# Patient Record
Sex: Male | Born: 1970
Health system: Western US, Academic
[De-identification: ages and names within clinical notes are randomized; demographics above are authoritative.]

## PROBLEM LIST (undated history)

## (undated) DIAGNOSIS — M5412 Radiculopathy, cervical region: Secondary | ICD-10-CM

## (undated) DIAGNOSIS — F419 Anxiety disorder, unspecified: Secondary | ICD-10-CM

## (undated) HISTORY — PX: HEMORROIDECTOMY: SUR656

## (undated) HISTORY — PX: ELBOW ARTHROSCOPY: SUR87

## (undated) HISTORY — PX: CARPAL TUNNEL RELEASE: SHX101

## (undated) HISTORY — DX: Anxiety disorder, unspecified: F41.9

## (undated) HISTORY — DX: Radiculopathy, cervical region: M54.12

---

## 2017-11-17 ENCOUNTER — Encounter (HOSPITAL_BASED_OUTPATIENT_CLINIC_OR_DEPARTMENT_OTHER): Payer: Self-pay

## 2017-12-26 ENCOUNTER — Encounter (HOSPITAL_BASED_OUTPATIENT_CLINIC_OR_DEPARTMENT_OTHER): Payer: Self-pay | Admitting: Gastroenterology

## 2017-12-26 ENCOUNTER — Ambulatory Visit (HOSPITAL_BASED_OUTPATIENT_CLINIC_OR_DEPARTMENT_OTHER): Payer: 59 | Attending: Gastroenterology | Admitting: Gastroenterology

## 2017-12-26 VITALS — BP 137/95 | HR 72 | Temp 97.7°F | Resp 15 | Ht 72.0 in | Wt 225.3 lb

## 2017-12-26 DIAGNOSIS — B182 Chronic viral hepatitis C: Secondary | ICD-10-CM | POA: Insufficient documentation

## 2017-12-26 DIAGNOSIS — Z6830 Body mass index (BMI) 30.0-30.9, adult: Secondary | ICD-10-CM

## 2017-12-26 NOTE — Progress Notes (Signed)
HEPATOLOGY CLINIC INITIAL CONSULTATION NOTE    OTHER PROVIDERS  PCP Miguel Dibble, ARNP      IDENTIFICATION/CHIEF COMPLAINT  [name] is a 47 year old male sent in consultation by PCP Miguel Dibble, ARNP for evaluation of chronic HCV infection.    HISTORY OF PRESENT ILLNESS  [name] is a 47 year old male who was originally diagnosed with hepatitis C about 5 years ago.  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  The following historical data was determined via prior notes and discussion with the patient today:  Fibrosis staging: Fibrosure F1  Prior treatment: None  Genotype: None    HIV negative  [id number]    His only complaint is wrist pain.  Otherwise, he feels well.    He recently received the second dose of HBV vaccine.    REVIEW OF SYSTEMS  Complete review of systems is negative except as noted in History of Present Illness.    PAST MEDICAL HISTORY  1. Chronic HCV infiection    ALLERGIES  Review of patient's allergies indicates:  No Known Allergies    MEDICATIONS  1. HCV  2. Neck and back pain - meditation    FAMILY HISTORY      SOCIAL HISTORY  The patient currently lives in [place] with his mom.  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 Looking for work now.    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  Sees counselor weekly and treatment once a week.        PHYSICAL EXAM  BP (!) 137/95    Pulse 72    Temp 97.7 F (36.5 C) (Temporal)    Resp 15    Ht 6' (1.829 m)    Wt (!) 225 lb 5 oz (102.2 kg)    BMI 30.56 kg/m     Constitutional/ General Apperance: Alert, no distress  Eyes: EOMI, no scleral icterus.  Oropharynx: Lips, mucosa, and tongue normal.  Neck: Neck supple. No adenopathy. No  goiter.  Lungs: Breathing comfortably on room air  Heart: Normal rate, regular rhythm. No edema  Abd: Abdomen soft, not distended.  Ext: Normal, without deformities, edema, or skin discoloration.  Neuro: Moving all extremities, gait normal, no asterixis.  Skin: Skin color, texture, turgor normal. No rashes or concerning lesions. No jaundice.        LABORATORY TESTS/STUDIES  Reviewed, see addendum below.    ASSESSMENT/PLAN  [name] is a 47 year old male sent in consultation by PCP Miguel Dibble, ARNP for evaluation of HCV.    HCV treatment:  Fibrosis staging: F1:  Fibrosure F1, APRI 0.39 (labs in care everywhere)  Genotype: 2  HBV status: Vacc  HIV status: negative  Lab testing: HCV RNA, CBC, Chem 7, liver tests, INR ordered/completed  Regimen: Mavyret x 8weeks      He should get up to date labs prior to starting HCV therapy (day treatment start is ok)        Thank you for allowing [place] to participate in the care of this patient. Please do not hesitate to call with any questions or concerns.    >50% of the time spent  with the patient was spent coordinating care and counseling the patient.   The total time spent with the patient was >45 minutes.        Laboratory and imaging addendum:

## 2017-12-27 ENCOUNTER — Ambulatory Visit (HOSPITAL_BASED_OUTPATIENT_CLINIC_OR_DEPARTMENT_OTHER): Payer: Self-pay

## 2017-12-27 LAB — HEPATITIS C RNA GENOTYPING: HCV RNA Genotype Result: 2

## 2017-12-27 NOTE — Progress Notes (Signed)
Stace Peace has been enrolled in [facility] Program.      Pharmacy benefits investigation is underway for Hepatitis C. See MEDICATION AUTHORIZATION referral for updates.

## 2017-12-28 ENCOUNTER — Ambulatory Visit (HOSPITAL_BASED_OUTPATIENT_CLINIC_OR_DEPARTMENT_OTHER): Payer: Self-pay | Admitting: Pharmacist Clinician (PhC)/ Clinical Pharmacy Specialist

## 2017-12-28 DIAGNOSIS — B182 Chronic viral hepatitis C: Secondary | ICD-10-CM

## 2017-12-28 MED ORDER — GLECAPREVIR-PIBRENTASVIR 100-40 MG OR TABS
3.0000 | ORAL_TABLET | Freq: Every day | ORAL | 1 refills | Status: DC
Start: 2017-12-28 — End: 2018-03-23

## 2017-12-28 NOTE — Progress Notes (Signed)
HCV Treatment Reviewed by Christa See, PharmD    Pt has been approved for treatment with Mavyret by [facility] for 8 wks.    Pt has a new medication start visit scheduled with the clinical pharmacist on 01/03/18.     Pt has been diagnosed with   HCV Genotype: 2;   Fibrosis stage: F1;   Fibrosis stage based on: fibrosure;   Cirrhosis status: N/A;   Child Pugh Class: N/A;   Treatment Status: treatment naive.    Medication(s) Clinically Reviewed:  yes  Drug(s):  Mavyret x 8 wks for HCV GT2, F1 fibrosis, tx naive  Indication Appropriate:  yes         RX APPROVAL     Patient Name: Evo, Aderman    MRN: [id number]   Referral #: [id number]     Prescription Insurance: PHARMACY BENEFIT MANAGER [place] [facility]   Pharmacy Coverage Subscriber ID: [id number] [place]   Group Number: N/A     Patient has received insurance approval for coverage of the following medication:     Medication: MAVYRET 100-40MG  TAB     Quantity/Days Supply: #84tabs/28-days     Approval Dates: 12/28/2017 to 03/14/2018     Approval #: N/A     Pharmacy Prescription Sent to: [facility]     Reason for this Pharmacy Choice: Patient choice     Cost of Medication to Patient: zero     Financial Assistance: none     Additional Comments: Mavyret approved for 8-weeks        There is no problem list on file for this patient.      HAV status: Unknown  HBV status: Negative sAb, unknown cAb - vaccine 12/25/2017, 11/23/2017  HIV status: Negative    Immunization History   Administered Date(s) Administered    Tdap vaccine 02/28/2013    hepatitis B vaccine (adult) 11/23/2017, 12/25/2017    influenza vaccine quadrivalent PF 12/04/2017          Current Outpatient Medications   Medication Sig Dispense Refill    glecaprevir-pibrentasvir (MAVYRET) 100-40 MG tablet Take 3 tablets by mouth daily. Take with food. 84 tablet 1     No current facility-administered medications for this visit.        Drug-drug interaction screen with current medications in EPIC as of 12/28/17:   Drug  Interactions Evaluated:  yes  Clinically Relevant Drug Interactions Identified:  no         Baseline labs reviewed (see below)  HCV Genotype  Recent Labs     12/26/17  1104   HCVGENORSLT 2              Sending Rx to [facility] for Mavyret X 8 weeks.

## 2018-01-03 ENCOUNTER — Ambulatory Visit (HOSPITAL_BASED_OUTPATIENT_CLINIC_OR_DEPARTMENT_OTHER): Payer: 59 | Attending: Gastroenterology | Admitting: Pharmacist Clinician (PhC)/ Clinical Pharmacy Specialist

## 2018-01-03 VITALS — BP 142/90 | HR 72 | Temp 97.5°F | Resp 16 | Wt 226.2 lb

## 2018-01-03 DIAGNOSIS — Z79899 Other long term (current) drug therapy: Secondary | ICD-10-CM | POA: Insufficient documentation

## 2018-01-03 DIAGNOSIS — Z7189 Other specified counseling: Secondary | ICD-10-CM | POA: Insufficient documentation

## 2018-01-03 DIAGNOSIS — B182 Chronic viral hepatitis C: Secondary | ICD-10-CM | POA: Insufficient documentation

## 2018-01-03 NOTE — Progress Notes (Addendum)
I agree with the assessment, plan of care, and interventions made. I have personally discussed the case with the student during or immediately after the patient visit including review of history, physical exam, diagnosis, and treatment plan.     Preceptor Pharmacist:   Ainhoa Rallo, PharmD  Clinical Pharmacist, [facility]  [facility]

## 2018-01-03 NOTE — Patient Instructions (Signed)
Pharmacist Visit for Hepatitis C Treatment: First Visit/1 Week Visit    You were seen by: Hale Bogus Information: [facility] Call Center:    24/7 Support [phone] Toll Free [phone]    (When calling, please say that you are calling about your Hepatitis C treatment)    Please start Mavyret (01/03/2018):  You will be on treatment for total of 8 weeks    Prescribed Regimen:   Mavyret (Glecaprevir/Pibrentasvir) 100mg /40mg  - Take 3 tables by mouth one time a day, Disp QTY: 84, Refill:  1  Duration of Therapy: 8 weeks  Start Date: 01/03/2018  Estimated End Date: 02/28/2018      Your Pharmacy that fills your medications:  [facility]:     24/7 Support [phone] Toll Free [phone]     Your medication Mavyret will be automatically refilled and ready for you to pick up from the pharmacy on the days that you are here for your follow-up appointments.       Please get the following labs done prior to checking in at your next Hepatitis C appointment:   Liver function tests or hepatic panel   Hepatitis C Viral Load   HAV   HBV    Other important information:     1. If you have any concerns and issues with your Hepatitis C medications, don't hesitate to contact the pharmacist or provider.    2. Please properly store all your medications.   Many of the medications are difficult to replace if lost or destroyed.   Some medications may need to be kept in a refrigerator.    3. Please come to all appointments and get your labs done as instructed.  If you are unable to come in for a scheduled visit, please contact your clinic directly.    4. Most hospitals or other facilities will not be able to provide HCV treatment during an inpatient visit.  Please be prepared to bring your own medication for planned admissions or have a family member or friend available to bring you your medications in case of emergency.    5. In most cases, prescriptions for Hepatitis C treatment  go through a very difficult authorization process with your insurance plan:   Please do not make any changes with your insurance coverage without talking with your pharmacist.   If you receive any document or notice of change of your insurance coverage, please contact your pharmacist immediately.    Patient Satisfaction Survey:     We would appreciate it if you could provide [place] with feedback on our [facility] Pharm[facility] services.     Please scan this code below with your cell phone to access the survey.          OR Please use the link below to access the survey.     [email or web address]

## 2018-01-03 NOTE — Progress Notes (Signed)
Referring Provider: Pati Gallo, MD   Referral Date: 12/26/2017    Interpreter Services: Interpreter not needed for this visit    SUBJECTIVE  Brian Le is a 47 year old male who presents to clinic for hepatitis C treatment initiation with Mavyret, week 1.     Social History:  - Tobacco: Zyn 6mg  nicotine pouches - uses 6-10 per day  - Alcohol: No alcohol - 08/30/2017 had last drink  - Marijuana: None  - IVDU: remote history    Pt reports having headaches recently and had a medication prescribed for it. Pt cannot remember the name of the medication.     Also states that he remembers discussing side effects of the medications, such as nausea and diarrhea.    Hepatitis C Treatment Summary  HCV Genotype: 2  Previously treated: No prior treatment pt was initially diagnosed 5 years ago    Prescribed Regimen:   Mavyret (Glecaprevir/Pibrentasvir) 100mg /40mg  - 3 tabs PO daily, Disp QTY: 84, Refill:  1  Duration of Therapy: 8 weeks  Start Date: 01/04/2018  Estimated End Date: 02/28/2018    Pharmacy: [facility]     PROBLEM LIST   There is no problem list on file for this patient.      IMMUNIZATIONS  Immunization History   Administered Date(s) Administered    Tdap vaccine 02/28/2013    hepatitis B vaccine (adult) 11/23/2017, 12/25/2017    influenza vaccine quadrivalent PF 12/04/2017       ALLERGIES   Review of patient's allergies indicates:  No Known Allergies    MEDICATIONS:  Medication list was reviewed and updated today in collaboration with the patient.   Current Outpatient Medications   Medication Sig Dispense Refill    glecaprevir-pibrentasvir (MAVYRET) 100-40 MG tablet Take 3 tablets by mouth daily. Take with food. 84 tablet 1     No current facility-administered medications for this visit.        RESULTS REVIEW  Vitals  BP (!) 142/90    Pulse 72    Temp 97.5 F (36.4 C) (Temporal)    Resp 16    Wt (!) 226 lb 3.1 oz (102.6 kg)    BMI 30.68 kg/m     Labs   HCV Genotype  Recent Labs     12/26/17  1104      HCVGENORSLT 2                 REVIEW OF SYSTEMS      Patient has no complaint:  Yes             ASSESSMENT/PLAN    Hepatitis C Treatment:   Brian Le is a 47 year old male who will be starting therapy with Mavyret as noted above.  Labs are up to date. Patient will follow-up in 4 weeks.      Possible drug interactions:   Drug Interactions Evaluated:  yes  Clinically Relevant Drug Interactions Identified:  no  Provided the Patient With Educational Material Regarding Drug Interactions:  yes  Comments:  Gave pt Mavyret booklet and AVS         Medication Review:  Medication(s) Clinically Reviewed:  yes  Drug(s):  Mavyret 3 tablets by mouth daily x8 weeks  Genotype 2  No prior treatment  Non-cirrhotic F1-F2  Indication Appropriate:  yes  Medication Optimized:  yes         Follow-Up and Monitoring:  F/U Protocol GLE/PIB (protocol for no cirrhosis):  Baseline Screen HBcAb, HBsAb, HBsAg  New Start                     Visit on 01/03/2018  4 weeks         Hepatic panel, Viral load  Visit on 01/31/2018  8 weeks                Visit on 03/06/2018     [facility]  Hepatic panel, Viral load   Visit on 05/23/2018    Additional monitoring may be needed for HBsAg positive, isolated anti-HBc, or for anti-HBs and anti-HBc (immune recovery).  See AASLD Guidance.    Adherence  Was Adherence assessed during encounter?:  no  Demonstrates Understanding of Importance of Adherence:  yes  Confirmed Plan for Next Specialty Medication Refill:  pick-up at pharmacy  Refills Needed for Supportive Medications:  not needed         Education/Patient Counseling:  Counseled the Patient on the Following:  possible drug interactions, adherence and missed doses, cost of medications/cost implications, doses and administration, lab monitoring and follow-up, possible adverse effects and management, safe handling, storage, and disposal, therapeutic rationale, pharmacy contact information, Provided Welcome Packet     -Disease overview  -Indication  for treatment and rationale for multiple agents  -Dosage and administration of Mavyret (glecaprevir-pibrentasvir) - Three tablets taken orally once daily - with food  -Importance of storing Mavyret (glecaprevir-pibrentasvir) safely as it is not replaceable if damaged or lost; dispensed in a 4 week (monthly) or 8 week carton (two months)  -Importance of adherence and what to do in case of a missed dose; if less than 18 hours since missed dose, then take the dose as soon as possible; if greater than 18 hours since missed dose, then skip missed dose and take the next dose at the usual time   -Need for lab monitoring and importance of keeping follow-up appointments  -How and when to contact the pharmacy for refills  -Potential side effects: headache, fatigue, nausea, diarrhea, and itching      -Potential for drug interactions: rifamycins, atazanavir, darunavir, lopinavir, ritonavir, efavirenz, carbamazepine, digoxin, dabigatran, warfarin (INR fluctuations possible), ethinyl estradiol containing products, HMG-CoA reductase inhibitors (atorvastatin, lovastatin, simvastatin, pravastatin, rosuvastatin, fluvastatin, pitavastatin), cyclosporine, PPIs, St. Johns wort and other over-the-counter (OTC) herbals/supplements  -Review safe practices to minimize transmission of HCV  -Review risk of HBV reactivation while on HCV treatment    I spent a total time of 30 minutes face-to-face with the patient, of which more than 50% was spent counseling and coordinating care as outlined in this note.    Ronalee Belts, PharmD

## 2018-01-04 DIAGNOSIS — B182 Chronic viral hepatitis C: Secondary | ICD-10-CM | POA: Insufficient documentation

## 2018-01-30 NOTE — Patient Instructions (Signed)
Pharmacist Visit for Hepatitis C Treatment: 4 Week Visit    You were seen by: Hale BogusMargaret Jaydan Chretien    [facility]:    24/7 Support [phone] Toll Free [phone]    (When calling, please say that you are calling about your Hepatitis C treatment)    Please continue taking Mavyret (01/31/2018):  You will be on treatment for total of 8 weeks    Prescribed Regimen:   Mavyret (Glecaprevir/Pibrentasvir) 100mg /40mg  - Take 3 tables by mouth one time a day with food, Disp QTY: 84, Refill:  1  Duration of Therapy: 8 weeks  Start Date: 01/03/2018  Estimated End Date: 02/28/2018      Your Pharmacy that fills your medications:  [facility]:     24/7 Support [phone] Toll Free [phone]     Your medication Mavyret will be automatically refilled and ready for you to pick up from the pharmacy on the days that you are here for your follow-up appointments.     Other important information:     1. If you have any concerns and issues with your Hepatitis C medications, don't hesitate to contact the pharmacist or provider.    2. Please properly store all your medications.  . Many of the medications are difficult to replace if lost or destroyed.  . Some medications may need to be kept in a refrigerator.    3. Please come to all appointments and get your labs done as instructed.  If you are unable to come in for a scheduled visit, please contact your clinic directly.    4. Most hospitals or other facilities will not be able to provide HCV treatment during an inpatient visit.  Please be prepared to bring your own medication for planned admissions or have a family member or friend available to bring you your medications in case of emergency.    5. In most cases, prescriptions for Hepatitis C treatment go through a very difficult authorization process with your insurance plan:  Marland Kitchen. Please do not make any changes with your insurance coverage without talking with your  pharmacist.  . If you receive any document or notice of change of your insurance coverage, please contact your pharmacist immediately.

## 2018-01-30 NOTE — Progress Notes (Addendum)
Referring Provider: Frederico Hamman, MD  Referral Date: 12/26/2017    Interpreter Services:  An interpreter was not needed for the visit.    SUBJECTIVE  Brian Le is a 47 year old male who presents to clinic for hepatitis C treatment monitoring, week 4. 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    Allergies and medications were reviewed with the patient. States that the only OTC medication that he takes sometimes is ibuprofen. States that he takes it "here and there." Denies taking any herbal supplements.    Pt reports taking Mavyret after he eats in the afternoon or in the evening. States that this is usually between 5-9PM. Endorses not remembering if he took his medication sometimes, but when he remembers that he missed a dose he takes it as soon as he remembers after his next meal. Reports finishing his last dose of his first months pack last night. States that he will pick up his refill at the pharmacy after his appointment.    Reports that he is always tired, but states that he cannot say if this is due to the medication. He reports being fatigued prior to ever taking this medication and cannot recall if it has gotten worse.    Pt also endorses headaches but states that he does not think this is due to the medication either. States that someone told him he has been having phantom alcohol hangovers which started a few months ago. 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    Pt denies any nausea or vomiting.    Pt reports having some itchy skin on his neck. States that it feels like a burning sensation sometimes. States that this occurs when he is sitting or laying down.    Pt reports having a cold for a few days now, however reports feeling worse upon waking up today. Pt reports that he hopes it will get better as he has 3 job [employer], [employer].     SH: Pt denies use of EtOH, marijuana, and other substances. Reports using Zyn tobacco pouches still. States that he is using the 24m and  uses about half a can per day (~8-9 pouches). Pt initially reported using "too many per day."    Pt reports craving a lot of sweets such as cookies since he has been sober of alcohol.     Hepatitis C Treatment Summary  HCV Genotype: 2  Previously treated: No, but pt was initially diagnosed 5 years ago    Prescribed Regimen:   Mavyret (Glecaprevir/Pibrentasvir) 1040m40mg - 3 tabs PO daily, Disp QTY: 84, Refill:  1  Duration of Therapy: 8 weeks  Start Date: 01/04/2018  Estimated End Date: 02/28/2018    HAV status: Unknown  HBV status: Negative sAb, unknown cAb - vaccine 12/25/2017, 11/23/2017  HIV status: Negative    [facility]  Rx Approval  Patient Name: Brian Le  MRN: [id number] Referral #: [id number]   Pres[place] Subscriber ID: [id number] Group Number: N/[id number]     Patient has received insurance approval for coverage of the following medication:     Medication: MAVYRET 100-40MG TAB     Quantity/Days Supply: #84tabs/28-days     Approval Dates: 12/28/2017 to 03/14/2018     Approval #: N/A     Pharmacy Prescription Sent to: [facility]   Reason for this Pharmacy  Choice: Patient choice     Cost of Medication to Patient: zero     Financial Assistance: none     Additional Comments: Mavyret approved for 8-weeks     PROBLEM LIST   Patient Active Problem List   Diagnosis   . Chronic hepatitis C without hepatic coma       IMMUNIZATIONS  Immunization History   Administered Date(s) Administered   . Tdap vaccine 02/28/2013   . hepatitis B vaccine (adult) 11/23/2017, 12/25/2017   . influenza vaccine quadrivalent PF 12/04/2017       ALLERGIES   Review of patient's allergies indicates:  No Known Allergies    MEDICATIONS:  Medication list was reviewed and updated today in collaboration with the patient.   Current Outpatient Medications   Medication Sig Dispense Refill   . glecaprevir-pibrentasvir (MAVYRET) 100-40 MG tablet Take 3 tablets  by mouth daily. Take with food. 84 tablet 1     No current facility-administered medications for this visit.        RESULTS REVIEW  Vitals  BP (!) 132/91   Pulse (!) 101   Temp 99.1 F (37.3 C) (Temporal)   Resp 18   Ht 6' (1.829 m)   Wt (!) 230 lb (104.3 kg)   BMI 31.19 kg/m     Labs   HCV Genotype  Recent Labs     12/26/17  1104   HCVGENORSLT 2         HCV Viral Load  No results for input(s): HCVRSLT, HCVRNACOPY in the last 26280 hours.  Liver Function Tests  Recent Labs     01/31/18  0911   ALBUMIN 4.8   PROTEIN 7.2   BILIRUBN 0.7   BILIRUBNDIR 0.1   ALK 79   AST 18   ALT 28     BMP  No results for input(s): SODIUM, POTASSIUM, CL, CO2, IONGAP, GLUCOSE, BUN, CREATININE, CA, GFRB, GFRA in the last 17520 hours.     CBC  No results for input(s): WBC, RBC, HEMOGLOBIN, HEMATOCRIT, MCV, MCH, MCHC, PLATELET, RDWCV in the last 17520 hours.       PT/INR (for cirrhotic patients)  No results for input(s): PROTIME, INR in the last 17520 hours.  AFP (alpha fetoprotein - select patients only)  No results for input(s): AFPNON in the last 26280 hours.  Hepatitis A Status  No results for input(s): HEPAIGCLASS, HEPAIGINT in the last 26280 hours.  Hepatitis B Status  No results for input(s): HEPBSURFAG, HEPBSURFAB, IUAB, HEPBSURFINT, BCAB, HEPBCOREINT in the last 26280 hours.    REVIEW OF SYSTEMS      Fatigue:  Pos   Pruritis:  Pos   Nausea:  Neg   Vomiting:  Neg   Headaches:  Pos             ASSESSMENT/PLAN  (Z79.899) Medication management  (primary encounter diagnosis)    (Z71.89) Encounter for medication counseling    (B18.2) Chronic hepatitis C without hepatic coma       Hepatitis C Treatment:   Brian Le is a 47 year old male who is continuing therapy with Mavyret as noted above.  Labs are up to date. Patient will follow-up in 4 weeks.      Hepatitis C Treatment Follow up:  1. Adherence - Pt reports good adherence with only some forgetfulness of doses that he takes when he remembers. States that he finished  his 1st month last night (01/30/2018) which is on track with 100% adherence.   2.  Side Effect Management - In regards to his itching skin, counseled patient on using a moisturizing cream, such as Aveeno, to see if that would alleviate the itching.  3. Laboratory Monitoring - Hepatic function panel all WNLs. Hep C RNA quant, Hep B Core Ab, and Hep A immune status still pending.   4. Plan -    Pick up [facility] refill from [facility] after this clinic visit.   Follow-up on labs once they return. If the patient is Hep B Core Ab positive, consider ordering a Hep B Surface Ag test and LFTs prior to his appointment with Dr. Elpidio Eric on 03/06/2018.   Follow up with Dr. Elpidio Eric on 03/06/2018 for end of treatment visit and on 05/23/2018 for SVR12.    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  See AASLD Guidance.    Adherence  Was Adherence assessed during encounter?:  yes  Patient Reported X Missed Doses in the Last Month:  0  Patient Reported Taking Incorrect Dose in the Last Month:  no  Gaps in Refill History Greater than 2 Weeks in the Last 3 Months:  no  Informant:  patient  Reliability of Informant:  fairly reliable  Reasons for Non-Adherence:  patient forgets  Other Non-Adherence Reason:  Pt forgets sometimes, but reportedly takes as soon as he remembers.  Adherence Tools Used:   none  Demonstrates Understanding of Importance of Adherence:  yes  Confirmed Plan for Next Specialty Medication Refill:  pick-up at pharmacy  Refills Needed for Supportive Medications:  not needed         Education/Patient Counseling:  Counseled the Patient on the Following:  possible adverse effects and management discussed, lab monitoring and follow-up discussed, adherence and missed doses discussed, timing of medications discussed, doses and administration, lab monitoring and follow-up, possible adverse effects and management, pharmacy contact information       -Disease overview  -Indication for treatment and rationale for multiple agents  -Dosage and administration of Mavyret (glecaprevir-pibrentasvir) - Three tablets taken orally once daily - with food  -Importance of storing Mavyret (glecaprevir-pibrentasvir) safely as it is not replaceable if damaged or lost; dispensed in a 4 week (monthly) or 8 week carton (two months)  -Importance of adherence and what to do in case of a missed dose; if less than 18 hours since missed dose, then take the dose as soon as possible; if greater than 18 hours since missed dose, then skip missed dose and take the next dose at the usual time   -Need for lab monitoring and importance of keeping follow-up appointments  -How and when to contact the pharmacy for refills  -Potential side effects: headache, fatigue, nausea, diarrhea, and itching      -  Potential for drug interactions: rifamycins, atazanavir, darunavir, lopinavir, ritonavir, efavirenz, carbamazepine, digoxin, dabigatran, warfarin (INR fluctuations possible), ethinyl estradiol containing products, HMG-CoA reductase inhibitors (atorvastatin, lovastatin, simvastatin, pravastatin, rosuvastatin, fluvastatin, pitavastatin), cyclosporine, PPIs, St. John's wort and other over-the-counter (OTC) herbals/supplements  -Review safe practices to minimize transmission of HCV  -Review risk of HBV reactivation while on HCV treatment    I  spent a total time of 15 minutes face-to-face with the patient, of which more than 50% was spent counseling and coordinating care as outlined in this note.    Earline Mayotte, PharmD

## 2018-01-31 ENCOUNTER — Ambulatory Visit (HOSPITAL_BASED_OUTPATIENT_CLINIC_OR_DEPARTMENT_OTHER): Payer: 59 | Attending: Gastroenterology | Admitting: Student in an Organized Health Care Education/Training Program

## 2018-01-31 VITALS — BP 132/91 | HR 101 | Temp 99.1°F | Resp 18 | Ht 72.0 in | Wt 230.0 lb

## 2018-01-31 DIAGNOSIS — Z79899 Other long term (current) drug therapy: Secondary | ICD-10-CM | POA: Insufficient documentation

## 2018-01-31 DIAGNOSIS — Z7189 Other specified counseling: Secondary | ICD-10-CM | POA: Insufficient documentation

## 2018-01-31 DIAGNOSIS — B182 Chronic viral hepatitis C: Secondary | ICD-10-CM | POA: Insufficient documentation

## 2018-01-31 LAB — HEPATIC FUNCTION PANEL
ALT (GPT): 28 U/L (ref 10–64)
AST (GOT): 18 U/L (ref 9–38)
Albumin: 4.8 g/dL (ref 3.5–5.2)
Alkaline Phosphatase (Total): 79 U/L (ref 39–139)
Bilirubin (Direct): 0.1 mg/dL (ref 0.0–0.3)
Bilirubin (Total): 0.7 mg/dL (ref 0.2–1.3)
Protein (Total): 7.2 g/dL (ref 6.0–8.2)

## 2018-01-31 NOTE — Progress Notes (Signed)
I discussed this patient with Dr. Margaret Montgomery, PharmD, PGY-1 Pharmacy Practice Resident. I have reviewed and confirm the findings, the assessment and the plan as documented in the resident's note. Any additional comments are below or have been added to the above note and I agree with the assessment and plan of care.     Pharmacist Preceptor: Othelia Riederer, PharmD, BCPS

## 2018-02-01 LAB — HEPATITIS A IMMUNE STATUS IGG: Hepatitis A IgG Antibody: NONREACTIVE

## 2018-02-01 LAB — HEPATITIS B CORE AB (HBCAB): Hepatitis B Core Ab: NONREACTIVE

## 2018-02-03 LAB — HEPATITIS C RNA, QUANT: Hepatitis C Quant Result: <12 [IU]/mL — AB

## 2018-02-19 ENCOUNTER — Encounter (HOSPITAL_BASED_OUTPATIENT_CLINIC_OR_DEPARTMENT_OTHER): Payer: Self-pay | Admitting: Pharmacist Clinician (PhC)/ Clinical Pharmacy Specialist

## 2018-02-27 ENCOUNTER — Telehealth (HOSPITAL_BASED_OUTPATIENT_CLINIC_OR_DEPARTMENT_OTHER): Payer: Self-pay | Admitting: Gastroenterology

## 2018-02-27 NOTE — Telephone Encounter (Signed)
RETURN CALL: Voicemail - Detailed Message      SUBJECT:  Cancellation/Reschedule Request     REASON: Patient started new job, unable to keep appointment on 03/06/18  ADDITIONAL INFORMATION: Please contact patient to reschedule any week day after 3pm or anytime on the weekend if available.    Thank you.

## 2018-03-01 NOTE — Telephone Encounter (Signed)
Left VM, returning patient's call. Provided call back # [phone] for pt to call us back to r/s his PharmD visit

## 2018-03-06 ENCOUNTER — Encounter (HOSPITAL_BASED_OUTPATIENT_CLINIC_OR_DEPARTMENT_OTHER): Payer: 59 | Admitting: Pharmacist Clinician (PhC)/ Clinical Pharmacy Specialist

## 2018-03-09 ENCOUNTER — Ambulatory Visit (HOSPITAL_BASED_OUTPATIENT_CLINIC_OR_DEPARTMENT_OTHER): Payer: Self-pay | Admitting: Pharmacist Clinician (PhC)/ Clinical Pharmacy Specialist

## 2018-03-09 NOTE — Progress Notes (Signed)
Reviewed patient with the [facility] Pharm[facility] Case Management System.     Pt has picked up refills of Specialty Rx appropriately.       Pt has completed necessary laboratory tests.  HCV Viral Load  Recent Labs     01/31/18  0911   HCVRSLT <12 A   HCVRNACOPY Not Calculated     LFTs  Recent Labs     01/31/18  0911   ALBUMIN 4.8   PROTEIN 7.2   BILIRUBN 0.7   BILIRUBNDIR 0.1   ALK 79   AST 18   ALT 28         Pt's next follow-up visit is scheduled for 03/21/18 with PharmD (reschedule missed EOT visit).

## 2018-03-21 ENCOUNTER — Encounter (HOSPITAL_BASED_OUTPATIENT_CLINIC_OR_DEPARTMENT_OTHER): Payer: 59 | Admitting: Pharmacist Clinician (PhC)/ Clinical Pharmacy Specialist

## 2018-03-23 ENCOUNTER — Ambulatory Visit (HOSPITAL_BASED_OUTPATIENT_CLINIC_OR_DEPARTMENT_OTHER): Payer: Self-pay | Admitting: Pharmacist Clinician (PhC)/ Clinical Pharmacy Specialist

## 2018-03-23 NOTE — Progress Notes (Signed)
Reviewed patient with the [facility] Specialty Pharmacy Case Management System.     Pt has picked up refills of Specialty Rx appropriately.       Pt has completed necessary laboratory tests.    HCV Viral Load  Recent Labs     01/31/18  0911   HCVRSLT <12 A   HCVRNACOPY Not Calculated     LFTs  Recent Labs     01/31/18  0911   ALBUMIN 4.8   PROTEIN 7.2   BILIRUBN 0.7   BILIRUBNDIR 0.1   ALK 79   AST 18   ALT 28       Pt's next follow-up visit is scheduled for 05/23/18 with Dr Elpidio Eric [facility] visit).    Pt should have completed tx w/ Mavyret x 8 weeks at this point.  No showed x 2 for EOT visit.     Will d/c Mavyret from Rx list and disenroll from [facility].

## 2018-04-24 ENCOUNTER — Inpatient Hospital Stay: Payer: Self-pay

## 2018-05-23 ENCOUNTER — Encounter (HOSPITAL_BASED_OUTPATIENT_CLINIC_OR_DEPARTMENT_OTHER): Payer: 59 | Admitting: Gastroenterology

## 2018-05-23 ENCOUNTER — Telehealth (HOSPITAL_BASED_OUTPATIENT_CLINIC_OR_DEPARTMENT_OTHER): Payer: Self-pay | Admitting: Gastroenterology

## 2018-05-23 ENCOUNTER — Ambulatory Visit (HOSPITAL_BASED_OUTPATIENT_CLINIC_OR_DEPARTMENT_OTHER): Payer: 59 | Admitting: Gastroenterology

## 2018-05-23 DIAGNOSIS — B182 Chronic viral hepatitis C: Secondary | ICD-10-CM

## 2018-05-23 NOTE — Telephone Encounter (Signed)
RETURN CALL: Voicemail - Detailed Message      SUBJECT:  Reschedule Request     REASON: Call came in late/Call missed  ADDITIONAL INFORMATION: Patient returning call from provider. States that the Phone Visit was scheduled for 9:40 but received call around 10:10 and had walked about from his phone for a moment. Patient requesting to have phone visit rescheduled for today if possible. Please advise.

## 2018-05-23 NOTE — Telephone Encounter (Signed)
HEPATOLOGY CLINIC  PHONE NOTE     OTHER PROVIDERS  PCP Brian Le, ARNP     IDENTIFICATION/CHIEF COMPLAINT  [name] is a 48 year old male phoned for followup of chronic HCV GT2  infection.     HISTORY OF PRESENT ILLNESS  Mr.Brian Le initiated HCV therapy with Mavyret in 12/2017.  He no showed for his EOT visit.     His only complaint is wrist pain.  Otherwise, he feels well.     He  received the second dose of HBV vaccine last fall.    He was seen in his local ED in Feb 2020 for chest pain.  He ws ruled out for MI with low suspicion for PE as well and deemed stable for further outpatient evaluation      PAST MEDICAL HISTORY  1. HCV infection  -Mavyret in 2019, SVR pending     ALLERGIES  Review of patient's allergies indicates:  No Known Allergies     MEDICATIONS  None       SOCIAL HISTORY  Tobacco use: Quit in 2019, 1ppd since age 62.  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  Sees counselor weekly and treatment once a week.      LABORATORY TESTS/STUDIES  Reviewed, see addendum below.     ASSESSMENT/PLAN  [name] is a 48 year old male sent in consultation by PCP Brian Le, ARNP for evaluation of HCV.     HCV treatment:  Fibrosis staging: F1:  Fibrosure F1, APRI 0.39 (labs in care everywhere)  Genotype: 2  HBV status: Vaccine - due for 3rd dose.    HIV status: negative  Lab testing: HCV RNA, , liver tests ordered   Regimen: Mavyret x 8 weeks completed        Thank you for allowing [place] to participate in the care of this patient. Please do not hesitate to call with any questions or concerns.     >50% of the time spent with the patient was spent coordinating care and counseling the patient.   The total time spent with the patient was  > 22 minutes.

## 2018-07-20 ENCOUNTER — Inpatient Hospital Stay: Payer: Self-pay

## 2018-08-28 ENCOUNTER — Encounter (HOSPITAL_BASED_OUTPATIENT_CLINIC_OR_DEPARTMENT_OTHER): Payer: 59 | Admitting: Gastroenterology

## 2018-09-19 ENCOUNTER — Telehealth (HOSPITAL_BASED_OUTPATIENT_CLINIC_OR_DEPARTMENT_OTHER): Payer: Self-pay

## 2018-09-19 NOTE — Telephone Encounter (Addendum)
Pt called to make an appointment with Dr. Elpidio Eric. He had recent labs done at [facility], was told his Hep C came back.  He completed HCV therapy at [facility] liver clinic Jan/2020. Wants to discuss results and possible re-treatment options.  appt scheduled for 8/4 4pm.  (prefers late afternoon appts.)    I called [facility] medical records [phone] and requested recent lab results for pt faxed to liver clinic fax [phone]    Addendum:  result scanned in epic media.  Also available on care everywhere.

## 2018-09-19 NOTE — Telephone Encounter (Signed)
[place], Thanks.  I saw the labs in care everywhere.

## 2018-10-02 ENCOUNTER — Encounter (HOSPITAL_BASED_OUTPATIENT_CLINIC_OR_DEPARTMENT_OTHER): Payer: 59 | Admitting: Gastroenterology

## 2019-08-30 ENCOUNTER — Emergency Department: Payer: Self-pay

## 2020-02-19 ENCOUNTER — Emergency Department: Payer: Self-pay

## 2020-02-26 ENCOUNTER — Emergency Department: Payer: Self-pay

## 2020-07-20 ENCOUNTER — Other Ambulatory Visit: Payer: Self-pay

## 2020-07-20 ENCOUNTER — Encounter (HOSPITAL_COMMUNITY): Payer: Self-pay | Admitting: Licensed Clinical Social Worker

## 2020-07-20 ENCOUNTER — Ambulatory Visit (INDEPENDENT_AMBULATORY_CARE_PROVIDER_SITE_OTHER): Payer: 59 | Admitting: Licensed Clinical Social Worker

## 2020-07-20 DIAGNOSIS — F411 Generalized anxiety disorder: Secondary | ICD-10-CM | POA: Insufficient documentation

## 2020-07-20 DIAGNOSIS — F4310 Post-traumatic stress disorder, unspecified: Secondary | ICD-10-CM | POA: Insufficient documentation

## 2020-07-20 DIAGNOSIS — F3181 Bipolar II disorder: Secondary | ICD-10-CM | POA: Insufficient documentation
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
## 2020-08-07 ENCOUNTER — Other Ambulatory Visit: Payer: Self-pay

## 2020-08-07 ENCOUNTER — Ambulatory Visit: Payer: Self-pay

## 2020-08-07 ENCOUNTER — Encounter: Payer: Self-pay | Admitting: Family Medicine

## 2020-08-07 ENCOUNTER — Ambulatory Visit (INDEPENDENT_AMBULATORY_CARE_PROVIDER_SITE_OTHER): Payer: 59 | Admitting: Family Medicine

## 2020-08-07 VITALS — BP 120/72 | Ht 71.5 in | Wt 234.0 lb

## 2020-08-07 DIAGNOSIS — M23203 Derangement of unspecified medial meniscus due to old tear or injury, right knee: Secondary | ICD-10-CM

## 2020-08-07 DIAGNOSIS — M1710 Unilateral primary osteoarthritis, unspecified knee: Secondary | ICD-10-CM | POA: Insufficient documentation

## 2020-08-07 DIAGNOSIS — M179 Osteoarthritis of knee, unspecified: Secondary | ICD-10-CM | POA: Insufficient documentation

## 2020-08-07 DIAGNOSIS — M25561 Pain in right knee: Secondary | ICD-10-CM

## 2020-08-07 MED ORDER — TRIAMCINOLONE ACETONIDE 40 MG/ML IJ SUSP
40.0000 mg | Freq: Once | INTRAMUSCULAR | Status: AC
  Administered 2020-08-07: 40 mg via INTRA_ARTICULAR

## 2020-08-07 NOTE — Progress Notes (Signed)
Scott Meza - 50 y.o. male MRN [id number]  Date of birth: April 30, 1970  SUBJECTIVE:  Including CC & ROS.  No chief complaint on file.   Scott Meza is a 50 y.o. male that is presenting with acute right knee pain.  Pain is gotten worse over the past few days.  It is waking him up from sleeping.  Pain is been ongoing for years.  Denies any specific injury.  It is worse the more he is on his knee.  Review of the right knee x-ray from 5/23 shows no acute changes.   Review of Systems See HPI   HISTORY: Past Medical, Surgical, Social, and Family History Reviewed & Updated per EMR.   Pertinent Historical Findings include:  History reviewed. No pertinent past medical history.  History reviewed. No pertinent surgical history.  History reviewed. No pertinent family history.  Social History   Socioeconomic History   Marital status: Single    Spouse name: Not on file   Number of children: Not on file   Years of education: Not on file   Highest education level: Not on file  Occupational History   Not on file  Tobacco Use   Smoking status: Former    Pack years: 0.00   Smokeless tobacco: Former    Types: Chew  Substance and Sexual Activity   Alcohol use: Not Currently    Comment: 2 weeks ago drank twice    Drug use: Not Currently    Types: Marijuana    Comment: smoked a marijuna joint last week.    Sexual activity: Yes    Partners: Female  Other Topics Concern   Not on file  Social History Narrative   Not on file   Social Determinants of Health   Financial Resource Strain: Low Risk    Difficulty of Paying Living Expenses: Not hard at all  Food Insecurity: No Food Insecurity   Worried About Programme researcher, broadcasting/film/video in the Last Year: Never true   Barista in the Last Year: Never true  Transportation Needs: No Transportation Needs   Lack of Transportation (Medical): No   Lack of Transportation (Non-Medical): No  Physical Activity: Inactive   Days  of Exercise per Week: 0 days   Minutes of Exercise per Session: 0 min  Stress: No Stress Concern Present   Feeling of Stress : Only a little  Social Connections: Moderately Isolated   Frequency of Communication with Friends and Family: Never   Frequency of Social Gatherings with Friends and Family: Never   Attends Religious Services: More than 4 times per year   Active Member of [employer] or Organizations: No   Attends Engineer, structural: Never   Marital Status: Living with partner  Intimate Partner Violence: Not At Risk   Fear of Current or Ex-Partner: No   Emotionally Abused: No   Physically Abused: No   Sexually Abused: No     PHYSICAL EXAM:  VS: BP 120/72 (BP Location: Left Arm, Patient Position: Sitting, Cuff Size: Large)   Ht 5' 11.5" (1.816 m)   Wt 234 lb (106.1 kg)   BMI 32.18 kg/m  Physical Exam Gen: NAD, alert, cooperative with exam, well-appearing MSK:  Right knee: No obvious effusion. Normal range of motion. Normal strength resistance. Tenderness along the joint space. Neurovascular intact  Limited ultrasound: Right knee:  Mild effusion suprapatellar pouch. Normal-appearing patellar tendon. Mild spurring at the insertion of the quadriceps tendon. Mild degenerative changes of the medial  meniscus. Degenerative changes of the lateral meniscus.  Summary: Degenerative changes of the meniscus appreciated  Ultrasound and interpretation by Clare Gandy, MD   Aspiration/Injection Procedure Note Scott Meza 02-18-1971  Procedure: Injection Indications: Right knee pain  Procedure Details Consent: Risks of procedure as well as the alternatives and risks of each were explained to the (patient/caregiver).  Consent for procedure obtained. Time Out: Verified patient identification, verified procedure, site/side was marked, verified correct patient position, special equipment/implants available, medications/allergies/relevent history reviewed,  required imaging and test results available.  Performed.  The area was cleaned with iodine and alcohol swabs.    The right knee superior lateral suprapatellar pouch was injected using 1 cc's of 40 mg Kenalog and 4 cc's of 0.25% bupivacaine with a 22 1 1/2" needle.  Ultrasound was used. Images were obtained in long views showing the injection.     A sterile dressing was applied.  Patient did tolerate procedure well.   ASSESSMENT & PLAN:   Degenerative tear of medial meniscus of right knee Has an effusion and changes of the meniscus.  Symptoms have been ongoing for several years intermittently. -Counseled on home exercise therapy and supportive care. -Injection today. -Could consider further imaging or physical therapy.

## 2020-08-07 NOTE — Addendum Note (Signed)
Addended by: Merrilyn Puma on: 08/07/2020 10:06 AM   Modules accepted: Orders

## 2020-08-07 NOTE — Patient Instructions (Signed)
Nice to meet you Please try ice  Please try the exercises   Please send me a message in MyChart with any questions or updates.  Please see me back in 4 weeks.   --Dr. Adrian Specht  

## 2020-08-07 NOTE — Assessment & Plan Note (Signed)
Has an effusion and changes of the meniscus.  Symptoms have been ongoing for several years intermittently. -Counseled on home exercise therapy and supportive care. -Injection today. -Could consider further imaging or physical therapy.

## 2020-09-04 ENCOUNTER — Ambulatory Visit (INDEPENDENT_AMBULATORY_CARE_PROVIDER_SITE_OTHER): Payer: 59 | Admitting: Family Medicine

## 2020-09-04 ENCOUNTER — Encounter: Payer: Self-pay | Admitting: Family Medicine

## 2020-09-04 ENCOUNTER — Other Ambulatory Visit: Payer: Self-pay

## 2020-09-04 VITALS — BP 132/90 | Ht 71.5 in | Wt 234.0 lb

## 2020-09-04 DIAGNOSIS — M23203 Derangement of unspecified medial meniscus due to old tear or injury, right knee: Secondary | ICD-10-CM | POA: Diagnosis not present

## 2020-09-04 NOTE — Patient Instructions (Signed)
Good to see you Please use ice as needed  Please try the brace  Please try physical therapy   Please send me a message in MyChart with any questions or updates.  Please see me back in 4 weeks.   --Dr. Jordan Likes

## 2020-09-04 NOTE — Assessment & Plan Note (Signed)
Has had improvement with the injection but pain is still occurring.  Likely having some instability lets contribute to his pain -Counseled on home exercise therapy and supportive care. -Reaction brace. -Referral to physical therapy. -Could consider gel injection or further imaging.

## 2020-09-04 NOTE — Progress Notes (Signed)
  Scott Meza - 50 y.o. male MRN [id number]  Date of birth: 1970/09/21  SUBJECTIVE:  Including CC & ROS.  No chief complaint on file.   Scott Meza is a 50 y.o. male that is following up for his right knee pain.  He did get improvement with the injection.  Pain is occurring with a mild intensity.  The pain is more significant when he is bending his knee or getting up from a seated position.    Review of Systems See HPI   HISTORY: Past Medical, Surgical, Social, and Family History Reviewed & Updated per EMR.   Pertinent Historical Findings include:  History reviewed. No pertinent past medical history.  History reviewed. No pertinent surgical history.  History reviewed. No pertinent family history.  Social History   Socioeconomic History   Marital status: Single    Spouse name: Not on file   Number of children: Not on file   Years of education: Not on file   Highest education level: Not on file  Occupational History   Not on file  Tobacco Use   Smoking status: Former    Pack years: 0.00   Smokeless tobacco: Former    Types: Chew  Substance and Sexual Activity   Alcohol use: Not Currently    Comment: 2 weeks ago drank twice    Drug use: Not Currently    Types: Marijuana    Comment: smoked a marijuna joint last week.    Sexual activity: Yes    Partners: Female  Other Topics Concern   Not on file  Social History Narrative   Not on file   Social Determinants of Health   Financial Resource Strain: Low Risk    Difficulty of Paying Living Expenses: Not hard at all  Food Insecurity: No Food Insecurity   Worried About Programme researcher, broadcasting/film/video in the Last Year: Never true   Barista in the Last Year: Never true  Transportation Needs: No Transportation Needs   Lack of Transportation (Medical): No   Lack of Transportation (Non-Medical): No  Physical Activity: Inactive   Days of Exercise per Week: 0 days   Minutes of Exercise per Session: 0 min   Stress: No Stress Concern Present   Feeling of Stress : Only a little  Social Connections: Moderately Isolated   Frequency of Communication with Friends and Family: Never   Frequency of Social Gatherings with Friends and Family: Never   Attends Religious Services: More than 4 times per year   Active Member of [employer] or Organizations: No   Attends Engineer, structural: Never   Marital Status: Living with partner  Intimate Partner Violence: Not At Risk   Fear of Current or Ex-Partner: No   Emotionally Abused: No   Physically Abused: No   Sexually Abused: No     PHYSICAL EXAM:  VS: BP 132/90 (BP Location: Left Arm, Patient Position: Sitting, Cuff Size: Large)   Ht 5' 11.5" (1.816 m)   Wt 234 lb (106.1 kg)   BMI 32.18 kg/m  Physical Exam Gen: NAD, alert, cooperative with exam, well-appearing      ASSESSMENT & PLAN:   Degenerative tear of medial meniscus of right knee Has had improvement with the injection but pain is still occurring.  Likely having some instability lets contribute to his pain -Counseled on home exercise therapy and supportive care. -Reaction brace. -Referral to physical therapy. -Could consider gel injection or further imaging.

## 2020-09-10 ENCOUNTER — Ambulatory Visit: Payer: 59 | Admitting: Physical Therapy

## 2020-09-17 ENCOUNTER — Ambulatory Visit (HOSPITAL_COMMUNITY): Payer: No Payment, Other | Admitting: Licensed Clinical Social Worker

## 2020-10-06 ENCOUNTER — Encounter: Payer: Self-pay | Admitting: Family Medicine

## 2020-10-06 ENCOUNTER — Ambulatory Visit (INDEPENDENT_AMBULATORY_CARE_PROVIDER_SITE_OTHER): Payer: 59 | Admitting: Family Medicine

## 2020-10-06 ENCOUNTER — Other Ambulatory Visit: Payer: Self-pay

## 2020-10-06 DIAGNOSIS — M1711 Unilateral primary osteoarthritis, right knee: Secondary | ICD-10-CM | POA: Diagnosis not present

## 2020-10-06 MED ORDER — PREDNISONE 5 MG PO TABS: ORAL_TABLET | ORAL | 0 refills | Status: DC

## 2020-10-06 NOTE — Assessment & Plan Note (Addendum)
Acute on chronic in nature.  Has not started physical therapy yet.  The brace interferes with many of his duties at work. -Counseled on home exercise therapy and supportive care. -Prednisone. -Pursue gel injection. -Will start physical therapy on the 22nd

## 2020-10-06 NOTE — Patient Instructions (Signed)
Good to see you Please try ice as needed   Please send me a message in MyChart with any questions or updates.  We'll call once the gel injection is in.   --Dr. Jordan Likes

## 2020-10-06 NOTE — Progress Notes (Signed)
  [name] - 50 y.o. male MRN [id number]  Date of birth: December 12, 1970  SUBJECTIVE:  Including CC & ROS.  No chief complaint on file.   Scott Meza is a 50 y.o. male that is presenting with acute worsening of his right knee pain.  He did well initially with a steroid injection but the pain has started returning.  It is worse when he is on his knee and loading the knee.   Review of Systems See HPI   HISTORY: Past Medical, Surgical, Social, and Family History Reviewed & Updated per EMR.   Pertinent Historical Findings include:  History reviewed. No pertinent past medical history.  History reviewed. No pertinent surgical history.  History reviewed. No pertinent family history.  Social History   Socioeconomic History   Marital status: Single    Spouse name: Not on file   Number of children: Not on file   Years of education: Not on file   Highest education level: Not on file  Occupational History   Not on file  Tobacco Use   Smoking status: Former   Smokeless tobacco: Former    Types: Chew  Substance and Sexual Activity   Alcohol use: Not Currently    Comment: 2 weeks ago drank twice    Drug use: Not Currently    Types: Marijuana    Comment: smoked a marijuna joint last week.    Sexual activity: Yes    Partners: Female  Other Topics Concern   Not on file  Social History Narrative   Not on file   Social Determinants of Health   Financial Resource Strain: Low Risk    Difficulty of Paying Living Expenses: Not hard at all  Food Insecurity: No Food Insecurity   Worried About Programme researcher, broadcasting/film/video in the Last Year: Never true   Barista in the Last Year: Never true  Transportation Needs: No Transportation Needs   Lack of Transportation (Medical): No   Lack of Transportation (Non-Medical): No  Physical Activity: Inactive   Days of Exercise per Week: 0 days   Minutes of Exercise per Session: 0 min  Stress: No Stress Concern Present    Feeling of Stress : Only a little  Social Connections: Moderately Isolated   Frequency of Communication with Friends and Family: Never   Frequency of Social Gatherings with Friends and Family: Never   Attends Religious Services: More than 4 times per year   Active Member of [employer] or Organizations: No   Attends Engineer, structural: Never   Marital Status: Living with partner  Intimate Partner Violence: Not At Risk   Fear of Current or Ex-Partner: No   Emotionally Abused: No   Physically Abused: No   Sexually Abused: No     PHYSICAL EXAM:  VS: BP 118/82 (BP Location: Left Arm, Patient Position: Sitting, Cuff Size: Large)   Ht 5' 11.5" (1.816 m)   Wt 234 lb (106.1 kg)   BMI 32.18 kg/m  Physical Exam Gen: NAD, alert, cooperative with exam, well-appearing      ASSESSMENT & PLAN:   OA (osteoarthritis) of knee Acute on chronic in nature.  Has not started physical therapy yet.  The brace interferes with many of his duties at work. -Counseled on home exercise therapy and supportive care. -Prednisone. -Pursue gel injection. -Will start physical therapy on the 22nd

## 2020-10-16 ENCOUNTER — Emergency Department (HOSPITAL_COMMUNITY)
Admission: EM | Admit: 2020-10-16 | Discharge: 2020-10-16 | Disposition: A | Discharge status: Home / Self Care | Payer: 59 | Attending: Emergency Medicine | Admitting: Emergency Medicine

## 2020-10-16 ENCOUNTER — Encounter (HOSPITAL_COMMUNITY): Payer: Self-pay | Admitting: *Deleted

## 2020-10-16 ENCOUNTER — Emergency Department (HOSPITAL_COMMUNITY): Payer: 59

## 2020-10-16 DIAGNOSIS — R519 Headache, unspecified: Secondary | ICD-10-CM | POA: Insufficient documentation

## 2020-10-16 DIAGNOSIS — H538 Other visual disturbances: Secondary | ICD-10-CM | POA: Diagnosis not present

## 2020-10-16 DIAGNOSIS — R202 Paresthesia of skin: Secondary | ICD-10-CM | POA: Insufficient documentation

## 2020-10-16 DIAGNOSIS — Z87891 Personal history of nicotine dependence: Secondary | ICD-10-CM | POA: Insufficient documentation

## 2020-10-16 DIAGNOSIS — M542 Cervicalgia: Secondary | ICD-10-CM | POA: Diagnosis not present

## 2020-10-16 DIAGNOSIS — M25511 Pain in right shoulder: Secondary | ICD-10-CM | POA: Diagnosis not present

## 2020-10-16 MED ORDER — PREDNISONE 10 MG (21) PO TBPK: ORAL_TABLET | Freq: Every day | ORAL | 0 refills | Status: DC

## 2020-10-16 NOTE — ED Triage Notes (Addendum)
Pt complains of neck and right shoulder pain x 1 week. He saw urgent care, has been taking muscle relaxer. Pain has not resolved, urgent care was told he needs to see spine specialist. Says he just wants referral to specialist.

## 2020-10-16 NOTE — ED Provider Notes (Signed)
Emergency Medicine Provider Triage Evaluation Note  Scott Meza , a 50 y.o. male  was evaluated in triage.  Pt complains of neck and right shoulder pain x 1 week. Patient denies inciting injury. Has noticed pain originating in his neck and shooting down the right side of his back and his right arm with associated numbness and tingling. Visited ED yesterday, left being being seen. No significant PMH, takes no medications.  Review of Systems  Positive: Neck pain, back pain, R shoulder pain, numbness, tingling Negative: Head injury, decreased sensation, dizziness, weakness, incontinence, saddle anesthesia  Physical Exam  BP (!) 151/101 (BP Location: Left Arm)   Pulse 86   Temp 98.7 F (37.1 C) (Oral)   Resp 16   SpO2 99%  Gen:   Awake, no distress   Resp:  Normal effort  MSK:   Moves extremities without difficulty Other:  Normal active ROM of shoulder with mild pain  Medical Decision Making  Medically screening exam initiated at 12:24 PM.  Appropriate orders placed.  Scott Meza was informed that the remainder of the evaluation will be completed by another provider, this initial triage assessment does not replace that evaluation, and the importance of remaining in the ED until their evaluation is complete.     Scott Meza 10/16/20 1224    Scott Manchester, MD 10/16/20 2020

## 2020-10-16 NOTE — ED Provider Notes (Signed)
[facility]-EMERGENCY DEPT Provider Note   CSN: [id number] Arrival date & time: 10/16/20  1116     History Chief Complaint  Patient presents with   Neck Pain    Scott Meza is a 50 y.o. male.  [name] is a 50 y/o male who presents with neck and right shoulder pain x 1 week. Patient denies inciting injury. Has noticed pain originating in his neck and shooting down the right side of his back and his right arm with associated numbness and tingling. Reports some headaches and blurred vision for several months. Has tried excedrin without relief. Seen at urgent care 8/17, given toradol injection, muscle relaxer and naproxen. Visited ED yesterday, left being being seen. Patient recently moved here and has had difficulty getting referral to spine specialist. Reports no medical problems, takes no medications. Denies recent trauma/injury, dizziness, weakness, loss of bladder/bowel function, saddle anesthesia.  The history is provided by the patient.  Neck Pain Pain location:  Generalized neck Quality:  Shooting Pain radiates to:  R shoulder (Right back) Pain severity:  Moderate Associated symptoms: headaches and numbness   Associated symptoms: no chest pain and no weakness       History reviewed. No pertinent past medical history.  Patient Active Problem List   Diagnosis Date Noted   OA (osteoarthritis) of knee 08/07/2020   Bipolar 2 disorder, major depressive episode (HCC) 07/20/2020   GAD (generalized anxiety disorder) 07/20/2020   PTSD (post-traumatic stress disorder) 07/20/2020    History reviewed. No pertinent surgical history.     No family history on file.  Social History   Tobacco Use   Smoking status: Former   Smokeless tobacco: Former    Types: Chew  Substance Use Topics   Alcohol use: Not Currently    Comment: 2 weeks ago drank twice    Drug use: Not Currently    Types: Marijuana    Comment: smoked a marijuna  joint last week.     Home Medications Prior to Admission medications   Medication Sig Start Date End Date Taking? Authorizing Provider  predniSONE (STERAPRED UNI-PAK 21 TAB) 10 MG (21) TBPK tablet Take by mouth daily. Take 6 tabs by mouth daily  for 2 days, then 5 tabs for 2 days, then 4 tabs for 2 days, then 3 tabs for 2 days, 2 tabs for 2 days, then 1 tab by mouth daily for 2 days 10/16/20  Yes Evita Merida T, PA-C  predniSONE (DELTASONE) 5 MG tablet Take 6 pills for first day, 5 pills second day, 4 pills third day, 3 pills fourth day, 2 pills the fifth day, and 1 pill sixth day. 10/06/20   Myra Rude, MD    Allergies    Patient has no known allergies.  Review of Systems   Review of Systems  Respiratory:  Negative for shortness of breath.   Cardiovascular:  Negative for chest pain.  Gastrointestinal:  Negative for abdominal pain, nausea and vomiting.  Musculoskeletal:  Positive for back pain, neck pain and neck stiffness. Negative for gait problem.  Neurological:  Positive for numbness and headaches. Negative for dizziness, weakness and light-headedness.  All other systems reviewed and are negative.  Physical Exam Updated Vital Signs BP (!) 151/101 (BP Location: Left Arm)   Pulse 86   Temp 98.7 F (37.1 C) (Oral)   Resp 16   SpO2 99%   Physical Exam Vitals and nursing note reviewed.  Constitutional:      Appearance:  Normal appearance.  HENT:     Head: Normocephalic and atraumatic.  Eyes:     Conjunctiva/sclera: Conjunctivae normal.  Neck:     Comments: Mild pain in neck with passive ROM. Cardiovascular:     Rate and Rhythm: Normal rate.     Pulses: Normal pulses.  Pulmonary:     Effort: Pulmonary effort is normal.  Musculoskeletal:        General: Normal range of motion.     Right shoulder: No deformity or bony tenderness. Normal range of motion. Normal strength. Normal pulse.     Left shoulder: Normal.     Cervical back: Normal range of motion. No signs of  trauma. Pain with movement and muscular tenderness present. No spinous process tenderness.     Comments: Mild pain with passive ROM of right shoulder. Sensation in tact. Pulses normal.  Skin:    General: Skin is warm and dry.  Neurological:     General: No focal deficit present.     Mental Status: He is alert.    ED Results / Procedures / Treatments   Labs (all labs ordered are listed, but only abnormal results are displayed) Labs Reviewed - No data to display  EKG None  Radiology DG Cervical Spine Complete  Result Date: 10/16/2020 CLINICAL DATA:  Neck pain EXAM: CERVICAL SPINE - COMPLETE 4+ VIEW COMPARISON:  None. FINDINGS: Vertebral body heights are preserved. Alignment is normal. There is uncovertebral arthropathy at C6-C7. There is mild right neural foraminal stenosis at C5-C6 and mild to moderate right neural foraminal stenosis at C6-C7. There is mild to moderate left neural foraminal stenosis at C5-C6. The prevertebral soft tissues are normal. IMPRESSION: 1. No acute findings. 2. Degenerative changes as above. Electronically Signed   By: Lesia Hausen M.D.   On: 10/16/2020 13:04    Procedures Procedures   Medications Ordered in ED Medications - No data to display  ED Course  I have reviewed the triage vital signs and the nursing notes.  Pertinent labs & imaging results that were available during my care of the patient were reviewed by me and considered in my medical decision making (see chart for details).    MDM Rules/Calculators/A&P                           Patient is a 50 y/o male who presents with neck and right shoulder pain x 1 week. Patient denies inciting injury. Has been seen at urgent care, given muscle relaxer and anti-inflammatories. No prior imaging of neck or spine. Recently moved to the area and having difficulty getting spine referral. Patient has full passive ROM of shoulder and neck with mild pain. Sensation in tact and pulses normal in the upper  extremities. No weakness in the legs, loss of bladder/bowel function, or saddle anesthesia.  Patient appears otherwise well. Cervical XR showed no acute fracture with some degenerative changes.  I believe his symptoms are likely due to a radiculopathy. I do not believe patient requires admission or inpatient evaluation/treatment at this time. Plan to discharge to home with prednisone taper for patient to take in conjunction with previously prescribed muscle relaxer. Patient given referral to neurosurgery. Patient agreeable to plan.  Final Clinical Impression(s) / ED Diagnoses Final diagnoses:  Neck pain  Acute pain of right shoulder    Rx / DC Orders ED Discharge Orders          Ordered    predniSONE (STERAPRED UNI-PAK  21 TAB) 10 MG (21) TBPK tablet  Daily        10/16/20 1327             Laker Thompson T, PA-C 10/16/20 1332    Gloris Manchester, MD 10/16/20 2026

## 2020-10-16 NOTE — Discharge Instructions (Addendum)
Your imaging today was reassuring there is no acute fracture in your neck that would be causing your symptoms. Your pain and numbness is likely due to a pinched nerve in your neck.   I am prescribing you a steroid taper which you should take in conjunction with the muscle relaxer you were previously prescribed. I have given you the phone number for the neurosurgeon's office to call and make an appointment for follow up.   Return to the ED for new or worsening headache or severe weakness.

## 2020-10-19 ENCOUNTER — Ambulatory Visit: Payer: 59 | Attending: Family Medicine | Admitting: Physical Therapy

## 2020-10-19 ENCOUNTER — Encounter: Payer: Self-pay | Admitting: Physical Therapy

## 2020-10-19 ENCOUNTER — Other Ambulatory Visit: Payer: Self-pay

## 2020-10-19 DIAGNOSIS — M25661 Stiffness of right knee, not elsewhere classified: Secondary | ICD-10-CM | POA: Insufficient documentation

## 2020-10-19 DIAGNOSIS — R252 Cramp and spasm: Secondary | ICD-10-CM | POA: Insufficient documentation

## 2020-10-19 DIAGNOSIS — M25561 Pain in right knee: Secondary | ICD-10-CM | POA: Insufficient documentation

## 2020-10-19 DIAGNOSIS — G8929 Other chronic pain: Secondary | ICD-10-CM | POA: Insufficient documentation

## 2020-10-19 NOTE — Patient Instructions (Signed)
Access Code: [email or web address] URL: [email or web address] Date: 10/19/2020 Prepared by: Harrie Foreman  Exercises Mini Squat with Counter Support - 1 x daily - 7 x weekly - 2 sets - 10 reps Reverse Lunge - 1 x daily - 7 x weekly - 2 sets - 10 reps

## 2020-10-19 NOTE — Therapy (Signed)
[facility] [address]  [address] [place], [place], [address] Phone: [phone]   Fax:  [phone]  Physical Therapy Evaluation  Patient Details  Name: Scott Meza MRN: [id number] Date of Birth: September 19, 1970 Referring Provider (PT): Clare Gandy   Encounter Date: 10/19/2020   PT End of Session - 10/19/20 1422     Visit Number 1    Number of Visits 12    Date for PT Re-Evaluation 11/30/20    Authorization - Number of Visits 30    PT Start Time 0802    PT Stop Time 0840    PT Time Calculation (min) 38 min    Activity Tolerance Patient tolerated treatment well    Behavior During Therapy [facility] for tasks assessed/performed             History reviewed. No pertinent past medical history.  History reviewed. No pertinent surgical history.  There were no vitals filed for this visit.    Subjective Assessment - 10/19/20 0806     Subjective Pt. reports R knee pain, still hurts to go up and down ladders, can't play disc golf, but hasn't done much for it.  Also recently in ED for neck pain, has follow up with spine doc tomorrow.    Pertinent History Bipolar, PTSD, acute neck pain, history of R meniscus tear    How long can you sit comfortably? depends on chair    How long can you stand comfortably? 4-5 min    How long can you walk comfortably? 10 minutes    Diagnostic tests diagnostic US    Patient Stated Goals strengthen my knee, need to be able to climb ladder, go upstairs without hurting    Currently in Pain? Yes    Pain Score 3     Pain Location Neck    Pain Descriptors / Indicators Aching    Pain Type Acute pain    Pain Onset 1 to 4 weeks ago    Pain Frequency Constant    Multiple Pain Sites Yes    Pain Score 2   increases 7-9/10 with activties   Pain Location Knee    Pain Orientation Right    Pain Descriptors / Indicators Stabbing    Pain Type Acute pain    Pain Frequency Intermittent     Aggravating Factors  up and down, bending, climbing ladders, stairs    Pain Relieving Factors rest    Effect of Pain on Daily Activities unable to work, play disc golf                [facility] PT Assessment - 10/19/20 0001       Assessment   Medical Diagnosis 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  No    Is the patient reluctant to leave their home because of a fear of falling?  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 ambulates independently with no apparent distress.    Focus on Therapeutic Outcomes (FOTO)  51; knee      Sensation   Light Touch Appears Intact      Posture/Postural Control   Posture/Postural Control No significant limitations      ROM / Strength   AROM / PROM / Strength AROM;Strength      AROM   Overall AROM  Within functional limits for tasks performed    AROM Assessment Site Knee      Strength   Overall Strength Within functional limits for tasks performed    Overall Strength Comments guarded R knee    Strength Assessment Site Hip;Knee    Right/Left Hip Right;Left    Right Hip Flexion 4+/5    Right Hip ABduction 4+/5    Right Hip ADduction  4+/5    Left Hip Flexion 4+/5    Left Hip ABduction 4+/5    Left Hip ADduction 4+/5    Right/Left Knee Right;Left    Right Knee Flexion 4+/5    Right Knee Extension 4+/5    Left Knee Flexion 4+/5    Left Knee Extension 4+/5      Flexibility   Soft Tissue Assessment /Muscle Length yes    Hamstrings tight      Palpation   Patella mobility decreased inf/sup    Palpation comment tenderness R MCL/pes anserine      Special Tests    Special Tests Knee Special Tests    Other special tests negative for instability, no clicking or catching      Ambulation/Gait   Ambulation/Gait Yes    Ambulation/Gait Assistance 7: Independent    Gait Pattern Step-through pattern;Within Functional Limits                        Objective measurements completed on examination: See above findings.       [facility] Adult PT Treatment/Exercise - 10/19/20 0001       Exercises   Exercises Knee/Hip      Knee/Hip Exercises: Standing   Forward Lunges Right;2 sets;10 reps    Functional Squat 2 sets;10 reps    Functional Squat Limitations pain free ROM, cues for technique                    PT Education - 10/19/20 0843     Education Details Education on findings, POC, modalities, and initial HEP.  Access Code: [facility]    Person(s) Educated Patient    Methods Explanation;Demonstration;Verbal cues;Handout    Comprehension Verbalized understanding;Returned demonstration              PT Short Term Goals - 10/19/20 1428       PT SHORT TERM GOAL #1   Title independent with initial HEP    Time 2    Period Weeks    Status New    Target Date 11/02/20               PT  Long Term Goals - 10/19/20 1429       PT LONG TERM GOAL #1   Title independent with progressed HEP    Time 6    Period Weeks    Status New    Target Date 11/30/20      PT LONG TERM GOAL #2   Title report no more than 3/10 R knee pain with ladders/stairs    Time 6    Period Weeks    Status New     Target Date 11/30/20      PT LONG TERM GOAL #3   Title report no more than 3/10 R knee pain with disc golf/walking activities.    Time 6    Period Weeks    Status New    Target Date 11/30/20                    Plan - 10/19/20 1423     Clinical Impression Statement Pt. is a 50 year old male referred for R knee pain, reporting history of R meniscus tear, had incident of increased pain/swelling beginning of July, reports pain now primarily with "loaded" positions - climbing stairs, ladders, disc golf.  He is limited more today by acute neck pain.  He demonstrates tightness in R quad and tenderness over R MCL/meniscus/pes anserine but no instability.  Give initial HEP for mini-squats/lunges for R quad strengthening, to perform to tolerance.  Educated on different modalities could consider (ionto, [place], dry needling).  He would benefit from skilled therapy to decrease R knee pain and improve ability to perform job/leisure activities.    Personal Factors and Comorbidities Comorbidity 1;Time since onset of injury/illness/exacerbation    Comorbidities history R meniscus tear    Examination-Activity Limitations Stairs;Stand;Squat    Examination-Participation Restrictions Community Activity;Occupation    Stability/Clinical Decision Making Stable/Uncomplicated    Clinical Decision Making Low    Rehab Potential Excellent    PT Frequency 2x / week    PT Duration 6 weeks    PT Treatment/Interventions ADLs/Self Care Home Management;Cryotherapy;Electrical Stimulation;Iontophoresis 4mg /ml Dexamethasone;Moist Heat;Ultrasound;Gait training;Stair training;Functional mobility training;Therapeutic activities;Therapeutic exercise;Balance training;Neuromuscular re-education;Patient/family education;Manual techniques;Passive range of motion;Dry needling;Taping;Vasopneumatic Device;Joint Manipulations    PT Next Visit Plan assess neck if given new referral, start knee strengthening, manual therapy    PT  Home Exercise Plan Access Code: [email or web address]    Consulted and Agree with Plan of Care Patient             Patient will benefit from skilled therapeutic intervention in order to improve the following deficits and impairments:  Decreased activity tolerance, Increased fascial restricitons, Improper body mechanics, Pain, Difficulty walking, Increased muscle spasms, Impaired flexibility  Visit Diagnosis: Chronic pain of right knee  Stiffness of right knee, not elsewhere classified  Cramp and spasm     Problem List Patient Active Problem List   Diagnosis Date Noted   OA (osteoarthritis) of knee 08/07/2020   Bipolar 2 disorder, major depressive episode (HCC) 07/20/2020   GAD (generalized anxiety disorder) 07/20/2020   PTSD (post-traumatic stress disorder) 07/20/2020    07/22/2020 PT, DPT 10/19/2020, 2:32 PM  [facility] [address]  [address] [place], [place], [place] Phone: [phone]   Fax:  [phone]  Name: JOACHIM CARTON MRN: Loyal Gambler Date of Birth: Sep 14, 1970

## 2020-11-04 ENCOUNTER — Other Ambulatory Visit: Payer: Self-pay

## 2020-11-04 ENCOUNTER — Ambulatory Visit: Payer: 59 | Attending: Family Medicine

## 2020-11-04 DIAGNOSIS — G8929 Other chronic pain: Secondary | ICD-10-CM | POA: Insufficient documentation

## 2020-11-04 DIAGNOSIS — M25561 Pain in right knee: Secondary | ICD-10-CM | POA: Insufficient documentation

## 2020-11-04 DIAGNOSIS — R252 Cramp and spasm: Secondary | ICD-10-CM

## 2020-11-04 DIAGNOSIS — M25661 Stiffness of right knee, not elsewhere classified: Secondary | ICD-10-CM | POA: Diagnosis present

## 2020-11-04 NOTE — Therapy (Addendum)
PHYSICAL THERAPY DISCHARGE SUMMARY  Visits from Start of Care: 2 including initial evaluation  Current functional level related to goals / functional outcomes: unknown   Remaining deficits: unknown   Education / Equipment: HEP  Plan: Patient is being discharged due to no show/cancellation policy.  Patient has only returned for one visit since initial evaluation on 10/19/20, and has no-showed 3 visits and cancelled 6 visits for other health concerns.  He will need new referral to return to physical therapy.        [facility] [address]  [address] [place], [place], [address] Phone: [phone]   Fax:  [phone]  Physical Therapy Treatment  Patient Details  Name: Scott Meza MRN: [id number] Date of Birth: [birth year] Referring Provider (PT): Clare Gandy   Encounter Date: 11/04/2020   PT End of Session - 11/04/20 0845     Visit Number 2    Number of Visits 12    Date for PT Re-Evaluation 11/30/20    Authorization - Number of Visits 30    PT Start Time 0801    PT Stop Time 0842    PT Time Calculation (min) 41 min    Activity Tolerance Patient tolerated treatment well;Patient limited by pain    Behavior During Therapy [facility] for tasks assessed/performed             History reviewed. No pertinent past medical history.  History reviewed. No pertinent surgical history.  There were no vitals filed for this visit.   Subjective Assessment - 11/04/20 0805     Subjective Feels like his knee hurts worse when he does stairs, squats, and bends. Having a lot of trouble with his neck more than knee.    Pertinent History Bipolar, PTSD, acute neck pain, history of R meniscus tear    Diagnostic tests diagnostic US    Patient Stated Goals strengthen my knee, need to be able to climb ladder, go upstairs without hurting    Currently in Pain? No/denies                               [facility]  Adult PT Treatment/Exercise - 11/04/20 0001       Exercises   Exercises Knee/Hip      Knee/Hip Exercises: Stretches   Active Hamstring Stretch Right;2 reps;30 seconds    Active Hamstring Stretch Limitations seated    Hip Flexor Stretch Right;2 reps;30 seconds    Hip Flexor Stretch Limitations supine with strap      Knee/Hip Exercises: Aerobic   Recumbent Bike L1x46min      Knee/Hip Exercises: Standing   Functional Squat 10 reps    Functional Squat Limitations mini squats at counter; cues for hip hinge    Wall Squat 10 reps;3 seconds    Other Standing Knee Exercises reverse lunge with counter support 10 reps    Other Standing Knee Exercises side steps with red TB 2x30 ft                     PT Education - 11/04/20 0844     Education Details HEP update: Access Code: [email or web address]    Person(s) Educated Patient    Methods Explanation;Demonstration;Verbal cues;Handout    Comprehension Verbalized understanding;Returned demonstration;Verbal cues required              PT Short Term Goals - 10/19/20 1428  PT SHORT TERM GOAL #1   Title independent with initial HEP    Time 2    Period Weeks    Status New    Target Date 11/02/20               PT Long Term Goals - 10/19/20 1429       PT LONG TERM GOAL #1   Title independent with progressed HEP    Time 6    Period Weeks    Status New    Target Date 11/30/20      PT LONG TERM GOAL #2   Title report no more than 3/10 R knee pain with ladders/stairs    Time 6    Period Weeks    Status New    Target Date 11/30/20      PT LONG TERM GOAL #3   Title report no more than 3/10 R knee pain with disc golf/walking activities.    Time 6    Period Weeks    Status New    Target Date 11/30/20                   Plan - 11/04/20 0846     Clinical Impression Statement Pt notes the most knee pain when doing squatting type of motions. Cues needed for hip hinges during squats. Added stretches to reduce  tightness and pulling on the R knee. He was limited by pain with squats but symptoms reduce after modifiying his technique. Would like to try MT on the R knee to address the tightness.    Personal Factors and Comorbidities Comorbidity 1;Time since onset of injury/illness/exacerbation    Comorbidities history R meniscus tear    PT Frequency 2x / week    PT Duration 6 weeks    PT Treatment/Interventions ADLs/Self Care Home Management;Cryotherapy;Electrical Stimulation;Iontophoresis 4mg /ml Dexamethasone;Moist Heat;Ultrasound;Gait training;Stair training;Functional mobility training;Therapeutic activities;Therapeutic exercise;Balance training;Neuromuscular re-education;Patient/family education;Manual techniques;Passive range of motion;Dry needling;Taping;Vasopneumatic Device;Joint Manipulations    PT Next Visit Plan assess neck if given new referral, knee strengthening, manual therapy    PT Home Exercise Plan Access Code: [email or web address]    Consulted and Agree with Plan of Care Patient             Patient will benefit from skilled therapeutic intervention in order to improve the following deficits and impairments:  Decreased activity tolerance, Increased fascial restricitons, Improper body mechanics, Pain, Difficulty walking, Increased muscle spasms, Impaired flexibility  Visit Diagnosis: Chronic pain of right knee  Stiffness of right knee, not elsewhere classified  Cramp and spasm     Problem List Patient Active Problem List   Diagnosis Date Noted   OA (osteoarthritis) of knee 08/07/2020   Bipolar 2 disorder, major depressive episode (HCC) 07/20/2020   GAD (generalized anxiety disorder) 07/20/2020   PTSD (post-traumatic stress disorder) 07/20/2020    07/22/2020, PTA 11/04/2020, 10:24 AM  [facility] [address]  [address] [place], [place], [place] Phone: [phone]   Fax:  [phone]  Name: Scott Meza MRN: Loyal Gambler Date of Birth: 1971/01/19

## 2020-11-05 ENCOUNTER — Ambulatory Visit: Payer: 59

## 2020-11-10 ENCOUNTER — Ambulatory Visit: Payer: 59

## 2020-11-13 ENCOUNTER — Ambulatory Visit: Payer: 59 | Admitting: Physical Therapy

## 2020-11-17 ENCOUNTER — Ambulatory Visit: Payer: 59

## 2020-11-20 ENCOUNTER — Encounter: Payer: 59 | Admitting: Physical Therapy

## 2020-11-24 ENCOUNTER — Ambulatory Visit: Payer: 59 | Admitting: Physical Therapy

## 2020-12-04 ENCOUNTER — Encounter: Payer: 59 | Admitting: Physical Therapy

## 2021-01-16 ENCOUNTER — Emergency Department (HOSPITAL_COMMUNITY)
Admission: EM | Admit: 2021-01-16 | Discharge: 2021-01-16 | Disposition: A | Discharge status: Home / Self Care | Payer: 59 | Attending: Emergency Medicine | Admitting: Emergency Medicine

## 2021-01-16 DIAGNOSIS — R202 Paresthesia of skin: Secondary | ICD-10-CM | POA: Insufficient documentation

## 2021-01-16 DIAGNOSIS — Z5321 Procedure and treatment not carried out due to patient leaving prior to being seen by health care provider: Secondary | ICD-10-CM | POA: Insufficient documentation

## 2021-01-16 NOTE — ED Notes (Signed)
Patient was called to take back to a room but no response.

## 2021-01-16 NOTE — ED Notes (Signed)
Patient was called to reassess vital signs but no response. x3 

## 2021-01-16 NOTE — ED Notes (Signed)
Patient was called to reassess vital signs but no response. x2 

## 2021-01-16 NOTE — ED Triage Notes (Signed)
Patient reports tingling/numbness in bilateral arms/fingers. Says it has been going on a long time but gotten worse in last week. Reports he cannot sleep. Says pain is 1-2/10.

## 2021-04-06 DIAGNOSIS — M5412 Radiculopathy, cervical region: Secondary | ICD-10-CM | POA: Insufficient documentation

## 2022-01-24 ENCOUNTER — Encounter: Payer: Self-pay | Admitting: Family Medicine

## 2022-01-24 ENCOUNTER — Ambulatory Visit (INDEPENDENT_AMBULATORY_CARE_PROVIDER_SITE_OTHER): Payer: Commercial Managed Care - HMO | Admitting: Family Medicine

## 2022-01-24 VITALS — BP 146/85 | HR 73 | Temp 97.8°F | Ht 71.0 in | Wt 240.2 lb

## 2022-01-24 DIAGNOSIS — Z7689 Persons encountering health services in other specified circumstances: Secondary | ICD-10-CM | POA: Diagnosis not present

## 2022-01-24 DIAGNOSIS — R030 Elevated blood-pressure reading, without diagnosis of hypertension: Secondary | ICD-10-CM

## 2022-01-24 DIAGNOSIS — K739 Chronic hepatitis, unspecified: Secondary | ICD-10-CM | POA: Diagnosis not present

## 2022-01-24 DIAGNOSIS — M5412 Radiculopathy, cervical region: Secondary | ICD-10-CM | POA: Diagnosis not present

## 2022-01-24 MED ORDER — DICLOFENAC SODIUM 75 MG PO TBEC: 75.0000 mg | DELAYED_RELEASE_TABLET | Freq: Two times a day (BID) | ORAL | 0 refills | Status: DC | PRN

## 2022-01-24 MED ORDER — BACLOFEN 10 MG PO TABS: 10.0000 mg | ORAL_TABLET | Freq: Three times a day (TID) | ORAL | 0 refills | Status: DC | PRN
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
## 2022-01-26 ENCOUNTER — Telehealth: Payer: Self-pay | Admitting: Family Medicine

## 2022-01-26 DIAGNOSIS — B182 Chronic viral hepatitis C: Secondary | ICD-10-CM

## 2022-01-26 LAB — COMPREHENSIVE METABOLIC PANEL
ALT: 74 IU/L — ABNORMAL HIGH (ref 0–44)
AST: 46 IU/L — ABNORMAL HIGH (ref 0–40)
Albumin/Globulin Ratio: 2 (ref 1.2–2.2)
Albumin: 4.7 g/dL (ref 4.1–5.1)
Alkaline Phosphatase: 84 IU/L (ref 44–121)
BUN/Creatinine Ratio: 11 (ref 9–20)
BUN: 9 mg/dL (ref 6–24)
Bilirubin Total: 0.4 mg/dL (ref 0.0–1.2)
CO2: 17 mmol/L — ABNORMAL LOW (ref 20–29)
Calcium: 9.5 mg/dL (ref 8.7–10.2)
Chloride: 104 mmol/L (ref 96–106)
Creatinine, Ser: 0.81 mg/dL (ref 0.76–1.27)
Globulin, Total: 2.3 g/dL (ref 1.5–4.5)
Glucose: 101 mg/dL — ABNORMAL HIGH (ref 70–99)
Potassium: 4.4 mmol/L (ref 3.5–5.2)
Sodium: 140 mmol/L (ref 134–144)
Total Protein: 7 g/dL (ref 6.0–8.5)
eGFR: 107 mL/min/{1.73_m2} (ref >59)

## 2022-01-26 LAB — HCV RT-PCR, QUANT (NON-GRAPH)
HCV log10: 6.658 log10 IU/mL
Hepatitis C Quantitation: 4550000 IU/mL

## 2022-01-26 LAB — HCV AB W REFLEX TO QUANT PCR: HCV Ab: REACTIVE — AB

## 2022-01-26 NOTE — Telephone Encounter (Signed)
Patient informed of results. Patient will contact us if has not heard for GI within one week .

## 2022-01-26 NOTE — Telephone Encounter (Signed)
Please inform pt his Hepatitis C screening was still positive along with his viral load.  I have placed referral to Gastroenterology in order to begin treatment for Hepatitis C.   FYI  (Pt was aware of his diagnosis previously, he was lost to follow up and never completed treatment for this before he moved to [facility])

## 2022-02-15 ENCOUNTER — Telehealth: Payer: Self-pay | Admitting: Family Medicine

## 2022-02-15 DIAGNOSIS — M5412 Radiculopathy, cervical region: Secondary | ICD-10-CM

## 2022-02-15 MED ORDER — BACLOFEN 10 MG PO TABS: 10.0000 mg | ORAL_TABLET | Freq: Three times a day (TID) | ORAL | 0 refills | Status: DC | PRN

## 2022-02-15 MED ORDER — DICLOFENAC SODIUM 75 MG PO TBEC: 75.0000 mg | DELAYED_RELEASE_TABLET | Freq: Two times a day (BID) | ORAL | 0 refills | Status: DC | PRN

## 2022-02-15 NOTE — Addendum Note (Signed)
Addended by: Suzan Slick on: 02/15/2022 01:49 PM   Modules accepted: Orders

## 2022-02-15 NOTE — Addendum Note (Signed)
Addended by: Elizabeth Palau on: 02/15/2022 11:49 AM   Modules accepted: Orders

## 2022-02-15 NOTE — Telephone Encounter (Signed)
If pt can look and see who is under network for Neurosurgery under his new insurance and let me know who he would like to see. I can then place the referral.  He will then need to bring that insurance card to office or attach on mychart. I have refilled his medicine for him for neck pain.

## 2022-02-15 NOTE — Telephone Encounter (Signed)
Patient states  his new insurance requires [facility] - pended this for your review.  He also states the medication is not helping neck pain and suggested he schedule a visit to discuss this with you.

## 2022-02-15 NOTE — Addendum Note (Signed)
Addended by: Elizabeth Palau on: 02/15/2022 01:19 PM   Modules accepted: Orders

## 2022-02-15 NOTE — Telephone Encounter (Signed)
Patient called and stated that he needs a new Neurosurgery referral for after the first of the year because his insurance is changing and the current location no longer accepts what he will have.  Patient also requested possibility for medication to be sent in to help him manage his neck pain. Patient requested to be notified if medication is sent in or if appointment is required. Katha Hamming

## 2022-02-15 NOTE — Telephone Encounter (Signed)
Pended  a neurology - clinic.  Shows as correct address for [facility]  [address] ,[place]  but shows under [facility], neurology.

## 2022-02-15 NOTE — Telephone Encounter (Signed)
Noted and aware. Signed off on referral to [facility]

## 2022-02-16 ENCOUNTER — Encounter: Payer: Self-pay | Admitting: Family Medicine

## 2022-02-16 ENCOUNTER — Ambulatory Visit (INDEPENDENT_AMBULATORY_CARE_PROVIDER_SITE_OTHER): Payer: Commercial Managed Care - HMO | Admitting: Family Medicine

## 2022-02-16 ENCOUNTER — Ambulatory Visit (INDEPENDENT_AMBULATORY_CARE_PROVIDER_SITE_OTHER): Payer: Commercial Managed Care - HMO

## 2022-02-16 VITALS — BP 154/84 | HR 82 | Ht 71.0 in | Wt 241.0 lb

## 2022-02-16 DIAGNOSIS — M542 Cervicalgia: Secondary | ICD-10-CM | POA: Diagnosis not present

## 2022-02-16 DIAGNOSIS — M5412 Radiculopathy, cervical region: Secondary | ICD-10-CM

## 2022-02-16 DIAGNOSIS — G8929 Other chronic pain: Secondary | ICD-10-CM

## 2022-02-16 MED ORDER — AMITRIPTYLINE HCL 25 MG PO TABS: 25.0000 mg | ORAL_TABLET | Freq: Every day | ORAL | 0 refills | Status: DC

## 2022-02-16 MED ORDER — LIDOCAINE 5 % EX PTCH: 1.0000 | MEDICATED_PATCH | Freq: Two times a day (BID) | CUTANEOUS | 0 refills | Status: DC

## 2022-02-16 MED ORDER — METHOCARBAMOL 500 MG PO TABS: 500.0000 mg | ORAL_TABLET | Freq: Three times a day (TID) | ORAL | 0 refills | Status: DC

## 2022-02-16 NOTE — Progress Notes (Signed)
Acute Office Visit  Subjective:     Patient ID: Scott Meza, male    DOB: Aug 17, 1970, 51 y.o.   MRN: [id number]  Chief Complaint  Patient presents with   Neck Pain    Neck Pain    Patient is in today for acute visit.  Pt has hx of chronic neck pain. He was seen [facility] yesterday. He says the provider Dr Wynetta Emery wanted to send him to PT and do MRI. Pt reports the pain is on both shoulders and posterior neck. His arms will also get numb. He can work for 4 hours and then is out for 2 -3  days due to pain and stiffness. Pain 6/10. Pt was given diclofenac and Baclofen during initial visit and this isn't working.  Previous xray from 2022 showed foraminal stenosis at that time. No imaging since 2022. Pt would like to function better and be able to work despite his neck condition.  Past Medical History:  Diagnosis Date   Anxiety    Cervical radiculopathy     Review of Systems  Musculoskeletal:  Positive for neck pain.  All other systems reviewed and are negative.       Objective:    BP (!) 154/84   Pulse 82   Ht 5\' 11"  (1.803 m)   Wt 241 lb (109.3 kg)   SpO2 99%   BMI 33.61 kg/m    Physical Exam Vitals and nursing note reviewed.  Constitutional:      Appearance: Normal appearance. He is normal weight.  HENT:     Head: Normocephalic and atraumatic.     Nose: Nose normal.     Mouth/Throat:     Mouth: Mucous membranes are moist.     Pharynx: Oropharynx is clear.  Eyes:     Extraocular Movements: Extraocular movements intact.     Conjunctiva/sclera: Conjunctivae normal.     Pupils: Pupils are equal, round, and reactive to light.  Neck:     Comments: Diminished ROM of cervical spine in all directions with positive Spurling's test R>L Cardiovascular:     Rate and Rhythm: Normal rate.     Pulses: Normal pulses.  Pulmonary:     Effort: Pulmonary effort is normal.     Breath sounds: Normal breath sounds.  Musculoskeletal:        General: Tenderness  present.  Skin:    Capillary Refill: Capillary refill takes less than 2 seconds.  Neurological:     General: No focal deficit present.     Mental Status: He is alert. Mental status is at baseline.  Psychiatric:        Mood and Affect: Mood normal.        Behavior: Behavior normal.        Thought Content: Thought content normal.        Judgment: Judgment normal.     No results found for any visits on 02/16/22.      Assessment & Plan:   Problem List Items Addressed This Visit       Nervous and Auditory   Cervical radiculopathy - Primary   Relevant Medications   methocarbamol (ROBAXIN) 500 MG tablet   lidocaine (LIDODERM) 5 %   amitriptyline (ELAVIL) 25 MG tablet   Other Relevant Orders   DG Cervical Spine Complete   Ambulatory referral to Physical Therapy    Meds ordered this encounter  Medications   methocarbamol (ROBAXIN) 500 MG tablet    Sig: Take 1 tablet (500 mg  total) by mouth 3 (three) times daily.    Dispense:  90 tablet    Refill:  0   lidocaine (LIDODERM) 5 %    Sig: Place 1 patch onto the skin every 12 (twelve) hours. Remove & Discard patch within 12 hours or as directed by MD    Dispense:  30 patch    Refill:  0   amitriptyline (ELAVIL) 25 MG tablet    Sig: Take 1 tablet (25 mg total) by mouth at bedtime.    Dispense:  30 tablet    Refill:  0   May continue Diclofenac for now, switch Baclofen to Robaxin. Add Elavil at night and lidocaine patch for pain.  Refer for PT and updated Xray of neck.  No follow-ups on file.  Suzan Slick, MD

## 2022-02-17 ENCOUNTER — Telehealth: Payer: Self-pay | Admitting: Family Medicine

## 2022-02-17 NOTE — Telephone Encounter (Signed)
Noted and aware. Will await final read on xrays of neck done yesterday and will need to send with new referral. Ok to send to Dr Atlee Abide with [facility] once xrays results return.

## 2022-02-17 NOTE — Telephone Encounter (Signed)
Inquired with ordering physician about neurosurgery referral location. Current listed provider is not in network with [facility], awaiting approval on new location of [facility] in [place] with a Neurosurgery provider, as this is the only location [facility] offers for the neurosurgery specialty.  Per provider, deferring referral from being sent until x-ray results are finalized.  Documenting into telephone encounter per provider preference. Katha Hamming

## 2022-02-21 ENCOUNTER — Other Ambulatory Visit: Payer: Self-pay | Admitting: Family Medicine

## 2022-02-21 DIAGNOSIS — M5412 Radiculopathy, cervical region: Secondary | ICD-10-CM

## 2022-02-22 NOTE — Telephone Encounter (Signed)
Referral sent to Dr. Atlee Abide at [facility], including OV Notes, Insurance (current and future), and imaging results. Katha Hamming

## 2022-02-22 NOTE — Telephone Encounter (Signed)
noted 

## 2022-03-03 ENCOUNTER — Telehealth: Payer: Self-pay | Admitting: Family Medicine

## 2022-03-03 ENCOUNTER — Encounter: Payer: Self-pay | Admitting: Family Medicine

## 2022-03-03 ENCOUNTER — Telehealth (INDEPENDENT_AMBULATORY_CARE_PROVIDER_SITE_OTHER): Payer: Commercial Managed Care - HMO | Admitting: Family Medicine

## 2022-03-03 VITALS — Ht 71.0 in

## 2022-03-03 DIAGNOSIS — R0789 Other chest pain: Secondary | ICD-10-CM

## 2022-03-03 DIAGNOSIS — R61 Generalized hyperhidrosis: Secondary | ICD-10-CM

## 2022-03-03 DIAGNOSIS — R0602 Shortness of breath: Secondary | ICD-10-CM

## 2022-03-03 DIAGNOSIS — B182 Chronic viral hepatitis C: Secondary | ICD-10-CM

## 2022-03-03 NOTE — Telephone Encounter (Signed)
Please try contacting Infectious Disease to see if they treat Hepatitis C [facility] for Infectious Disease [address] [place],  [place]  [address] Get Driving Directions Main: [phone]

## 2022-03-03 NOTE — Telephone Encounter (Signed)
Called [facility] to inquire about the status of referral, as directed by PCP. GI stated they do not treat Hep C and he needs to be referred to a different practice. Scott Meza

## 2022-03-03 NOTE — Addendum Note (Signed)
Addended by: Rae Lips on: 03/03/2022 03:48 PM   Modules accepted: Orders

## 2022-03-03 NOTE — Progress Notes (Signed)
I connected with  Scott Meza on 03/03/22 by a video enabled telemedicine application and verified that I am speaking with the correct person using two identifiers.   I discussed the limitations of evaluation and management by telemedicine. The patient expressed understanding and agreed to proceed.   Acute Office Visit  Subjective:     Patient ID: Scott Meza, male    DOB: [birth year], 52 y.o.   MRN: [id number]  Chief Complaint  Patient presents with   Acute Visit    Patient - virtual visit- c/o  chills, bodyaches, sweats, fatigued, difficulty eating - loss of appetite, chest heaviness, dyspnea,  x  new years day 02/28/22- symptoms varying in intensity- patient requesting rx rf of omeprazole but does not remember strength he uses    HPI Patient is in today for acute visit.  Pt reports since New Year's Day, he's felt bad. Symptoms include sweats, chills, chest pains and shortness of breath. He says it's difficult to even speak due to the shortness of breath. He says he thinks it's his lungs. Denies cough. Says his chest feels heavy in the middle of his chest. Denies vomiting. Also having loss of appetite during this time.  Review of Systems  Constitutional:  Positive for chills, diaphoresis and malaise/fatigue. Negative for fever.  Respiratory:  Positive for shortness of breath. Negative for cough and wheezing.   Cardiovascular:  Positive for chest pain.  All other systems reviewed and are negative.       Objective:    Ht 5\' 11"  (1.803 m)   BMI 33.61 kg/m    Physical Exam Vitals reviewed.  Constitutional:      General: He is in acute distress.     Appearance: He is normal weight. He is diaphoretic.  HENT:     Head: Normocephalic and atraumatic.     Right Ear: External ear normal.     Left Ear: External ear normal.  Pulmonary:     Effort: No respiratory distress.  Neurological:     General: No focal deficit present.     Mental Status: He is alert  and oriented to person, place, and time. Mental status is at baseline.  Psychiatric:        Mood and Affect: Mood normal.        Behavior: Behavior normal.        Thought Content: Thought content normal.        Judgment: Judgment normal.    No results found for any visits on 03/03/22.      Assessment & Plan:   Problem List Items Addressed This Visit   None Visit Diagnoses     Other chest pain    -  Primary   Diaphoresis       SOB (shortness of breath)          Explained to pt the limitations via telehealth visit on proper diagnosis and treatment but with his symptoms of chest pains, diaphoresis and SOB with difficulty completing full sentences; advise on prompt ER evaluation. Pt is in agreement and will have someone take him.  I told pt we will contact him in a few minutes to see if he's on his way. He voiced understanding.  Nurse Maudie Mercury called x2 and was unable to reach patient. Left VM I then called pt and reached him on his cell phone. He reports he is at [facility] in [place] now.   No orders of the defined types were placed  in this encounter.   No follow-ups on file.  Leeanne Rio, MD

## 2022-03-03 NOTE — Addendum Note (Signed)
Addended by: Leeanne Rio on: 03/03/2022 04:01 PM   Modules accepted: Orders

## 2022-03-03 NOTE — Telephone Encounter (Signed)
Referral signed.

## 2022-03-03 NOTE — Telephone Encounter (Signed)
GI is the specialist that I would associate Hepatitis C treatment with. Maudie Mercury would you mind calling [facility] and inquiring about this? It clearly states on their website that they treat Liver disease. [phone]

## 2022-03-15 ENCOUNTER — Other Ambulatory Visit: Payer: Self-pay | Admitting: Family Medicine

## 2022-03-15 DIAGNOSIS — M5412 Radiculopathy, cervical region: Secondary | ICD-10-CM

## 2022-03-16 ENCOUNTER — Encounter: Payer: Self-pay | Admitting: Family Medicine

## 2022-03-16 ENCOUNTER — Telehealth: Payer: Self-pay | Admitting: Family Medicine

## 2022-03-16 ENCOUNTER — Ambulatory Visit (INDEPENDENT_AMBULATORY_CARE_PROVIDER_SITE_OTHER): Payer: Self-pay | Admitting: Family Medicine

## 2022-03-16 ENCOUNTER — Other Ambulatory Visit: Payer: Self-pay | Admitting: Family Medicine

## 2022-03-16 VITALS — BP 133/79 | HR 89 | Temp 98.2°F | Ht 71.0 in | Wt 235.2 lb

## 2022-03-16 DIAGNOSIS — M5412 Radiculopathy, cervical region: Secondary | ICD-10-CM

## 2022-03-16 DIAGNOSIS — K219 Gastro-esophageal reflux disease without esophagitis: Secondary | ICD-10-CM

## 2022-03-16 DIAGNOSIS — Z1159 Encounter for screening for other viral diseases: Secondary | ICD-10-CM

## 2022-03-16 MED ORDER — OMEPRAZOLE 20 MG PO CPDR: 20.0000 mg | DELAYED_RELEASE_CAPSULE | Freq: Every day | ORAL | 3 refills | Status: DC

## 2022-03-16 NOTE — Progress Notes (Signed)
   Acute Office Visit  Subjective:     Patient ID: Scott Meza, male    DOB: 08-Feb-1971, 52 y.o.   MRN: [id number]  Chief Complaint  Patient presents with   requesting HIV testing     Patient in office requesting HIV testing - requesting results  be faxed to  [phone] [facility]- ATTN: Cari Caraway-  also just [place]- patient states  has upcoming PT schld for 03/31/22 for neck pain and schld for 03/28/22 for infectious disease for Hep C treatment.     HPI Patient is in today for acute visit.  Pt is here for HIV testing. He has had chronic neck pain. Has been referred to PT and has first appt in February. He also has chronic HEP C and GERD. He sees Infectious Disease this month for treatment initiation. Requests refills on his Omeprazole.   Review of Systems  Gastrointestinal:  Positive for heartburn.  Musculoskeletal:  Positive for neck pain.  All other systems reviewed and are negative.       Objective:    BP 133/79   Pulse 89   Temp 98.2 F (36.8 C)   Ht 5\' 11"  (1.803 m)   Wt 235 lb 3 oz (106.7 kg)   SpO2 95%   BMI 32.80 kg/m    Physical Exam Vitals and nursing note reviewed.  Constitutional:      Appearance: Normal appearance. He is normal weight.  HENT:     Head: Normocephalic and atraumatic.     Right Ear: External ear normal.     Left Ear: External ear normal.     Nose: Nose normal.  Eyes:     Extraocular Movements: Extraocular movements intact.  Cardiovascular:     Rate and Rhythm: Normal rate.  Pulmonary:     Effort: Pulmonary effort is normal.  Neurological:     General: No focal deficit present.     Mental Status: He is alert and oriented to person, place, and time. Mental status is at baseline.  Psychiatric:        Mood and Affect: Mood normal.        Behavior: Behavior normal.        Thought Content: Thought content normal.        Judgment: Judgment normal.    No results found for any visits on 03/16/22.      Assessment  & Plan:   Problem List Items Addressed This Visit       Nervous and Auditory   Cervical radiculopathy - Primary   Other Visit Diagnoses     Screening for viral disease       Relevant Orders   HIV Antibody (routine testing w rflx)   Gastroesophageal reflux disease without esophagitis       Relevant Medications   omeprazole (PRILOSEC) 20 MG capsule       Meds ordered this encounter  Medications   omeprazole (PRILOSEC) 20 MG capsule    Sig: Take 1 capsule (20 mg total) by mouth daily.    Dispense:  30 capsule    Refill:  3   To start PT soon for chronic neck pain. Referral to NSG pending appt Refilled Omeprazole See ID for Hep C treatment HIV testing today. Return in about 6 months (around 09/14/2022) for Chronic condition follow up.  Leeanne Rio, MD

## 2022-03-16 NOTE — Telephone Encounter (Signed)
Patient made the statement over the phone that it is for a religious group. I do not have the details, as I was provided vague context and a phone number. Scott Meza

## 2022-03-16 NOTE — Telephone Encounter (Signed)
Patient requested results of HIV Testing be faxed over to [phone], provided by patient at check-in. [facility]

## 2022-03-16 NOTE — Telephone Encounter (Signed)
Please clarify where test results are going?

## 2022-03-16 NOTE — Telephone Encounter (Signed)
Per Website:  Mailing Address: [address] [place], [place] [address] Office Phone: [phone] Fax: [phone] Email: [email or web address]  Attempted to call, voicemail was not available to request confirmation. Buel Ream

## 2022-03-16 NOTE — Telephone Encounter (Signed)
Noted. Just need to confirm that's the correct fax # for location before sending sensitive information over fax.

## 2022-03-17 LAB — HIV ANTIBODY (ROUTINE TESTING W REFLEX): HIV Screen 4th Generation wRfx: NONREACTIVE

## 2022-03-25 ENCOUNTER — Telehealth: Payer: Self-pay

## 2022-03-25 ENCOUNTER — Other Ambulatory Visit (HOSPITAL_COMMUNITY): Payer: Self-pay

## 2022-03-25 NOTE — Telephone Encounter (Signed)
[facility] Patient Advocate Encounter  Insurance verification completed.    The patient is insured through [employer].  Medication will need a PA.  We will continue to follow to see if copay assistance is needed.  Breckon Reeves, CPhT [facility] Phone: [phone] Fax:  [phone]  

## 2022-03-28 ENCOUNTER — Encounter: Payer: Medicaid Other | Admitting: Internal Medicine

## 2022-03-28 NOTE — Progress Notes (Deleted)
[facility]                                      [address], [place], [place], [address]                                               Phn. [phone]; Fax: [phone]                                                               Date:  Reason for Visit: Hepatitis C    HPI: Scott Meza is a 52 y.o.old male with HCV infection HCV VL 4.55Mill on 01/24/22.  Referred by PCP as patient reported history hepatitis C and was taking medications for it.  Noted that he was lost to follow-up.  Denies h/o injectable or intranasal cocaine use, blood transfusion, sharing of toothbrushes/razors, or sexual contact with known positive partners, incarceration or Armed forces logistics/support/administrative officer.  No personal or family history of liver disease, Hepatitis or Liver cancer.  He has not received treatment to date   Denies any hospitalizations related to liver disease, jaundice, ascites, GI bleeding, mental status changes, abdominal pain and acholic stool.   ROS: Denies yellowish discoloration of sclera and skin, abdominal pain/distension, hematemesis.  Denis cough, fever, chills, nightsweats, nausea, vomiting, diarrhea, constipation, weight loss, recent hospitalizations, rashes, joint complaints, shortness of breath, chest pain, headaches, dysuria .   Current Outpatient Medications on File Prior to Visit  Medication Sig Dispense Refill   amitriptyline (ELAVIL) 25 MG tablet TAKE 1 TABLET(25 MG) BY MOUTH AT BEDTIME 30 tablet 0   diclofenac (VOLTAREN) 75 MG EC tablet TAKE 1 TABLET(75 MG) BY MOUTH TWICE DAILY AS NEEDED FOR MILD PAIN OR MODERATE PAIN 60 tablet 0   lidocaine (LIDODERM) 5 % Place 1 patch onto the skin every 12 (twelve) hours. Remove & Discard patch within 12 hours or as directed by MD 30 patch 0   methocarbamol (ROBAXIN) 500 MG tablet Take 1 tablet (500 mg total) by mouth 3 (three) times daily. 90 tablet 0   omeprazole (PRILOSEC) 20  MG capsule Take 1 capsule (20 mg total) by mouth daily. 30 capsule 3   No current facility-administered medications on file prior to visit.   No Known Allergies  Past Medical History:  Diagnosis Date   Anxiety    Cervical radiculopathy     Past Surgical History:  Procedure Laterality Date   CARPAL TUNNEL RELEASE Right    ELBOW ARTHROSCOPY Right     Social History   Socioeconomic History   Marital status: Single    Spouse name: Not on file   Number of children: Not on file   Years of education: Not on file   Highest education level: Not on file  Occupational History   Not on file  Tobacco Use   Smoking status: Former   Smokeless tobacco: Former    Types: Chew  Substance and Sexual Activity   Alcohol use: Not Currently    Comment: 2 weeks ago drank twice  Drug use: Not Currently    Types: Marijuana    Comment: smoked a marijuna joint last week.    Sexual activity: Yes    Partners: Female  Other Topics Concern   Not on file  Social History Narrative   Not on file   Social Determinants of Health   Financial Resource Strain: Low Risk  (07/20/2020)   Overall Financial Resource Strain (CARDIA)    Difficulty of Paying Living Expenses: Not hard at all  Food Insecurity: No Food Insecurity (07/20/2020)   Hunger Vital Sign    Worried About Running Out of Food in the Last Year: Never true    [place] in the Last Year: Never true  Transportation Needs: No Transportation Needs (07/20/2020)   PRAPARE - Hydrologist (Medical): No    Lack of Transportation (Non-Medical): No  Physical Activity: Inactive (07/20/2020)   Exercise Vital Sign    Days of Exercise per Week: 0 days    Minutes of Exercise per Session: 0 min  Stress: No Stress Concern Present (07/20/2020)   [place]    Feeling of Stress : Only a little  Social Connections: Moderately Isolated (07/20/2020)   Social  Connection and Isolation Panel [NHANES]    Frequency of Communication with Friends and Family: Never    Frequency of Social Gatherings with Friends and Family: Never    Attends Religious Services: More than 4 times per year    Active Member of [employer] or Organizations: No    Attends Archivist Meetings: Never    Marital Status: Living with partner  Intimate Partner Violence: Not At Risk (07/20/2020)   Humiliation, Afraid, Rape, and Kick questionnaire    Fear of Current or Ex-Partner: No    Emotionally Abused: No    Physically Abused: No    Sexually Abused: No    Family History  Adopted: Yes    Physical exam: There were no vitals taken for this visit.  Gen: Alert and oriented x 3, no acute distress HEENT: [place]/AT, PERL, EOMI, no scleral icterus, no pale conjunctivae, hearing normal, oral mucosa moist Neck: Supple, no lymphadenopathy Cardio: Regular rate and rhythm; +S1 and S2; no murmurs, gallops, or rubs Resp: CTAB; no wheezes, rhonchi, or rales GI: Soft, nontender, nondistended, bowel sounds present GU: Musc: Extremities: No cyanosis, clubbing, or edema; +2 PT and DP pulses Skin: No rashes, lesions, or ecchymoses Neuro: No focal deficits Psych: Calm, cooperative   Laboratory  CBC w diff CMP PT/PTT/INR Hep A and B serologies  HIV HCV genotype HCV RNA Measure of fibrosis NS5A resistance testing in select scenarios    Assessment/Plan:  Hepatitis C Prior treatment:  GT: Evidence of cirrhosis:  Interested in treatment: Potential DDI   Smoking/Alcohol/Illicit substance use    Counseling done on the following -Natural progression of hep c, transmission (avoid sharing personal hygiene equipment), prevention, risks of left untreated and treatment options  -Avoid hepatotoxins like alcohol and excessive acetamaminphen (no more than 2 gram a day) -Avoid eating raw sea food -Risks of re-infection  -Hepatitis coinfection and vaccination( Pneumococcal  vaccination in the cirrhotics    - [place] screening with [place] every 6 months - EGD to r/o varices in cirrhotics    Electronically signed by:  Laurice Record, MD Infectious Diseases  Fax no. [phone]

## 2022-03-30 NOTE — Therapy (Deleted)
OUTPATIENT PHYSICAL THERAPY CERVICAL EVALUATION   Patient Name: Scott Meza MRN: [id number] DOB:1970/10/03, 52 y.o., male Today's Date: 03/30/2022  END OF SESSION:   Past Medical History:  Diagnosis Date   Anxiety    Cervical radiculopathy    Past Surgical History:  Procedure Laterality Date   CARPAL TUNNEL RELEASE Right    ELBOW ARTHROSCOPY Right    Patient Active Problem List   Diagnosis Date Noted   Cervical radiculopathy 04/06/2021   OA (osteoarthritis) of knee 08/07/2020   Bipolar 2 disorder, major depressive episode ([place]) 07/20/2020   GAD (generalized anxiety disorder) 07/20/2020   PTSD (post-traumatic stress disorder) 07/20/2020    PCP: Dr Catalina Antigua  REFERRING PROVIDER: Dr Catalina Antigua  REFERRING DIAG: Cervical radiculopathy   THERAPY DIAG:  No diagnosis found.  Rationale for Evaluation and Treatment: Rehabilitation  ONSET DATE: ***  SUBJECTIVE:                                                                                                                                                                                                         SUBJECTIVE STATEMENT: ***  PERTINENT HISTORY:  ***  PAIN:  Are you having pain? Yes: NPRS scale: ***/10 Pain location: *** Pain description: *** Aggravating factors: *** Relieving factors: ***  PRECAUTIONS: None  WEIGHT BEARING RESTRICTIONS: No  FALLS:  Has patient fallen in last 6 months? No  LIVING ENVIRONMENT: Lives with: lives with their family and lives alone Lives in: House/apartment   OCCUPATION: ***  PLOF: Independent  PATIENT GOALS: ***  NEXT MD VISIT: ***  OBJECTIVE:   DIAGNOSTIC FINDINGS:  Cervical xray 02/19/22: Degenerative changes. No acute osseous abnormalities.   PATIENT SURVEYS:  FOTO ***  COGNITION: Overall cognitive status: Within functional limits for tasks assessed  SENSATION: {sensation:27233}  POSTURE: Patient presents with head forward  posture with increased thoracic kyphosis; shoulders rounded and elevated; scapulae abducted and rotated along the thoracic spine; head of the humerus anterior in orientation.    PALPATION: ***   CERVICAL ROM:   Active ROM A/PROM (deg) eval  Flexion   Extension   Right lateral flexion   Left lateral flexion   Right rotation   Left rotation    (Blank rows = not tested)  UPPER EXTREMITY ROM:  Active ROM Right eval Left eval  Shoulder flexion    Shoulder extension    Shoulder abduction    Shoulder adduction    Shoulder extension    Shoulder internal rotation    Shoulder external rotation    Elbow flexion  Elbow extension    Wrist flexion    Wrist extension    Wrist ulnar deviation    Wrist radial deviation    Wrist pronation    Wrist supination     (Blank rows = not tested)  UPPER EXTREMITY MMT:  MMT Right eval Left eval  Shoulder flexion    Shoulder extension    Shoulder abduction    Shoulder adduction    Shoulder extension    Shoulder internal rotation    Shoulder external rotation    Middle trapezius    Lower trapezius    Elbow flexion    Elbow extension    Wrist flexion    Wrist extension    Wrist ulnar deviation    Wrist radial deviation    Wrist pronation    Wrist supination    Grip strength     (Blank rows = not tested)  CERVICAL SPECIAL TESTS:  {Cervical special tests:25246}  FUNCTIONAL TESTS:  {Functional tests:24029}  TODAY'S TREATMENT:                                                                                                                              [facility] Adult PT Treatment:                                                DATE: 03/31/22 Therapeutic Exercise: *** Manual Therapy: *** Neuromuscular re-ed: *** Therapeutic Activity: *** Modalities: *** Self Care: ***   PATIENT EDUCATION:  Education details: POC; HEP  Person educated: Patient Education method: Explanation, Demonstration, Tactile cues, Verbal cues, and  Handouts Education comprehension: verbalized understanding, returned demonstration, verbal cues required, tactile cues required, and needs further education  HOME EXERCISE PROGRAM: ***  ASSESSMENT:  CLINICAL IMPRESSION: Patient is a 52 y.o. male who was seen today for physical therapy evaluation and treatment for cervical radiculopathy which is chronic in nature.   OBJECTIVE IMPAIRMENTS: decreased activity tolerance, decreased ROM, decreased strength, increased fascial restrictions, impaired flexibility, improper body mechanics, postural dysfunction, and pain.   ACTIVITY LIMITATIONS: carrying, lifting, and reach over head  PARTICIPATION LIMITATIONS: occupation, yard work, and ADL's  PERSONAL FACTORS: {Personal factors:25162} are also affecting patient's functional outcome.   REHAB POTENTIAL: Good  CLINICAL DECISION MAKING: Stable/uncomplicated  EVALUATION COMPLEXITY: Low   GOALS: Goals reviewed with patient? Yes  SHORT TERM GOALS: Target date: 04/28/2022  *** Baseline:  Goal status: INITIAL  2.  *** Baseline:  Goal status: INITIAL  3.  *** Baseline:  Goal status: INITIAL   LONG TERM GOALS: Target date: 05/26/2022   *** Baseline:  Goal status: INITIAL  2.  *** Baseline: *** Goal status: {GOALSTATUS:25110}  3.  *** Baseline:  Goal status: INITIAL  4.  *** Baseline:  Goal status: INITIAL  5.  *** Baseline:  Goal status: INITIAL  6.  ***  Baseline:  Goal status: INITIAL   PLAN:  PT FREQUENCY: 2x/week  PT DURATION: 8 weeks  PLANNED INTERVENTIONS: Therapeutic exercises, Therapeutic activity, Neuromuscular re-education, Patient/Family education, Self Care, Joint mobilization, Aquatic Therapy, Dry Needling, Electrical stimulation, Cryotherapy, Moist heat, Taping, Traction, Ultrasound, Ionotophoresis '4mg'$ /ml Dexamethasone, Manual therapy, and Re-evaluation  PLAN FOR NEXT SESSION: review and progress exercise; postural correction and education; manual  work, DN, modalities as indicate    [employer], PT 03/30/2022, 4:59 PM

## 2022-03-31 ENCOUNTER — Ambulatory Visit: Payer: Medicaid Other | Attending: Family Medicine | Admitting: Rehabilitative and Restorative Service Providers"

## 2022-04-26 ENCOUNTER — Encounter: Payer: Commercial Managed Care - HMO | Admitting: Family Medicine

## 2022-05-31 IMAGING — CR DG CERVICAL SPINE COMPLETE 4+V
5 series · 5 of 5 positions shown · non-contrast
Comparison: None.

CLINICAL DATA: Neck pain

EXAM:
CERVICAL SPINE - COMPLETE 4+ VIEW

[w cervical spine lat]
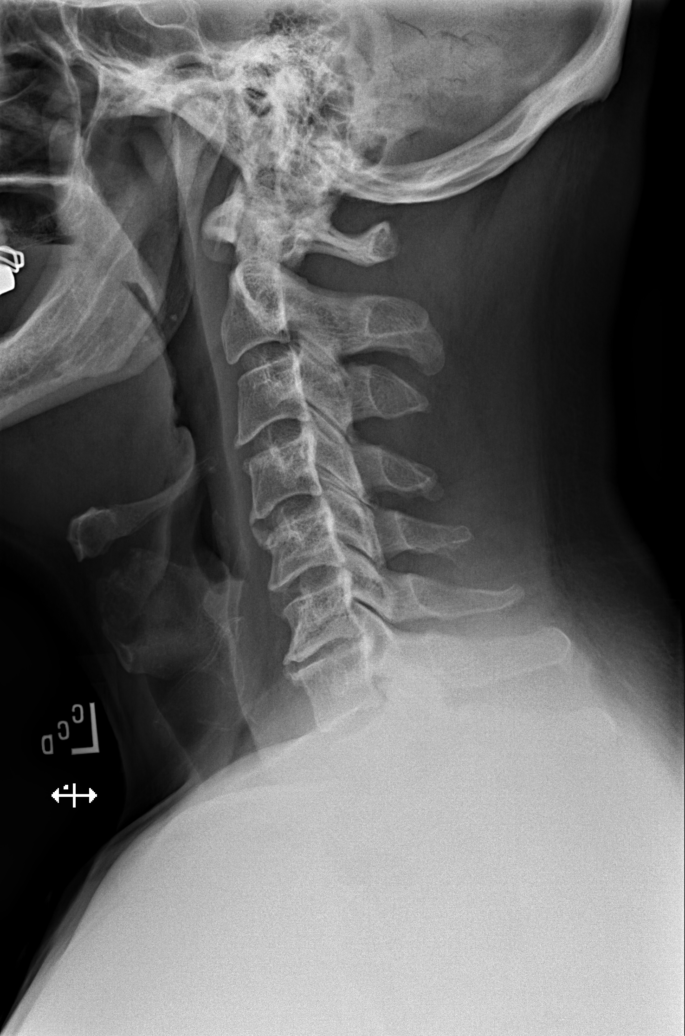

[w cervical spine ap_obl (1 of 2)]
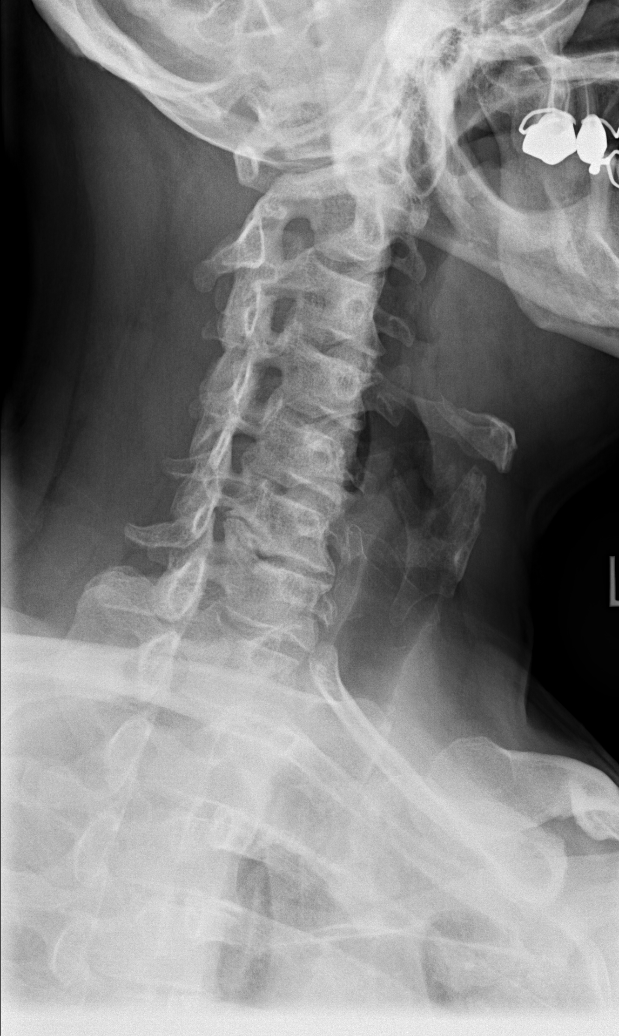

[w cervical spine ap_obl (2 of 2)]
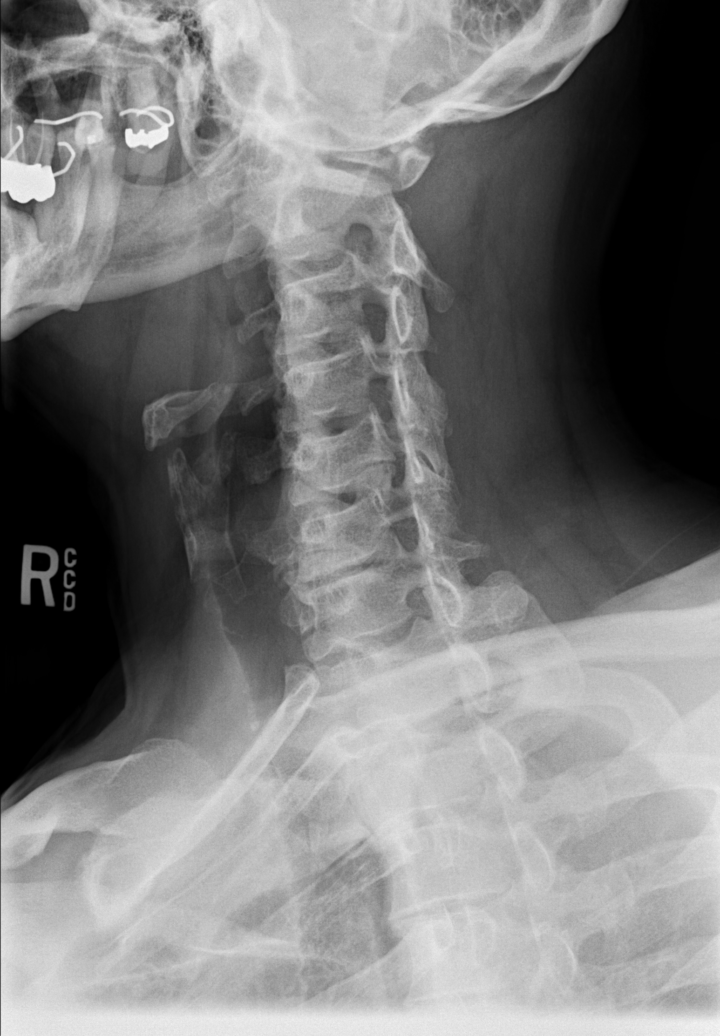

[w cervical spine ap]
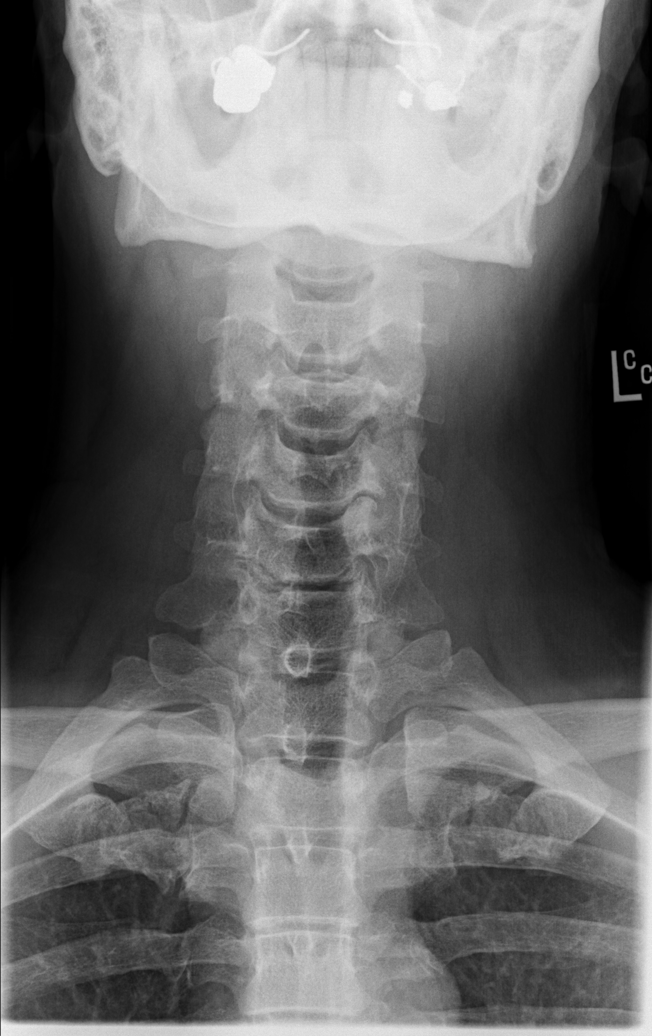

[w cervical spine odontoid]
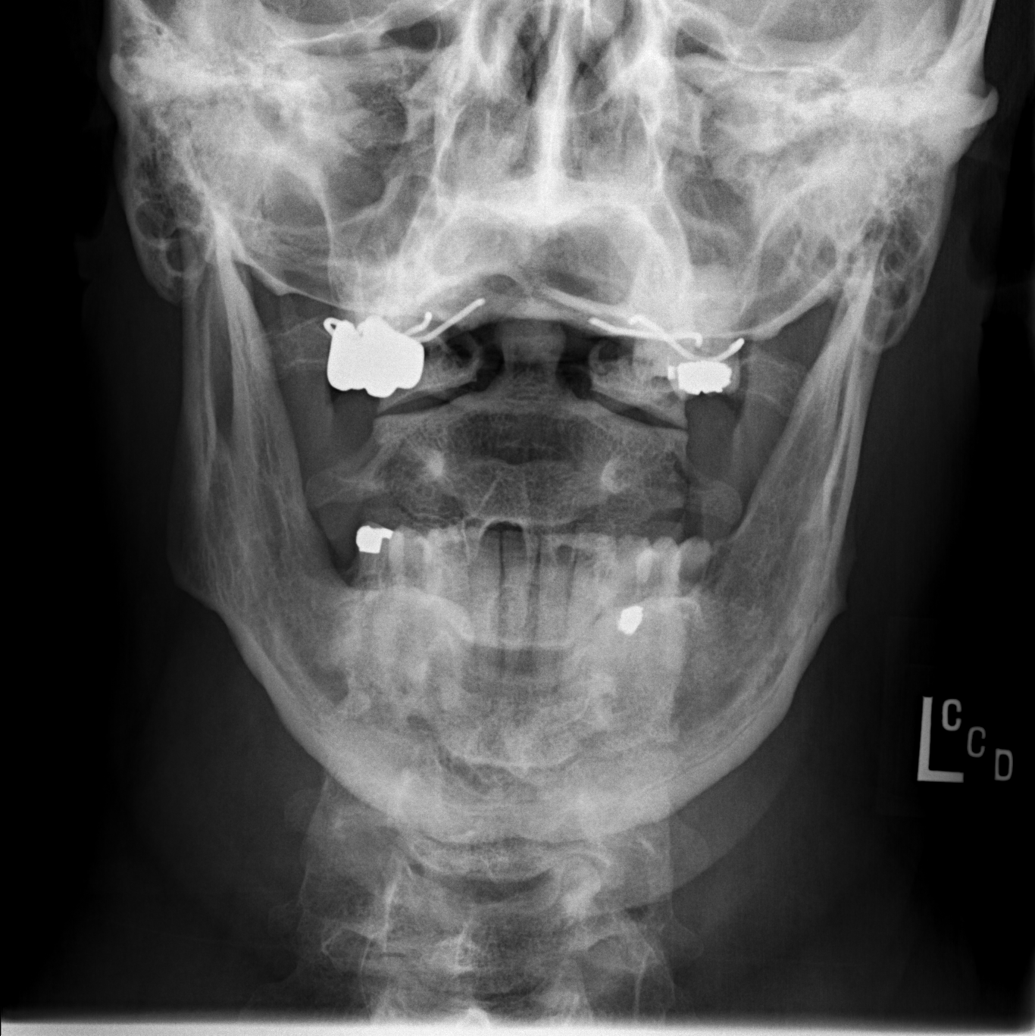

[5 of 5 positions shown; findings below may reference images not displayed]

FINDINGS: Vertebral body heights are preserved. Alignment is normal. There is
uncovertebral arthropathy at C6-C7. There is mild right neural
foraminal stenosis at C5-C6 and mild to moderate right neural
foraminal stenosis at C6-C7. There is mild to moderate left neural
foraminal stenosis at C5-C6.

The prevertebral soft tissues are normal.
IMPRESSION: 1. No acute findings.
2. Degenerative changes as above.

## 2022-06-13 ENCOUNTER — Encounter: Payer: Self-pay | Admitting: *Deleted

## 2022-12-26 ENCOUNTER — Encounter (HOSPITAL_BASED_OUTPATIENT_CLINIC_OR_DEPARTMENT_OTHER): Payer: Self-pay | Admitting: Pediatrics

## 2022-12-26 ENCOUNTER — Other Ambulatory Visit: Payer: Self-pay

## 2022-12-26 ENCOUNTER — Other Ambulatory Visit (HOSPITAL_BASED_OUTPATIENT_CLINIC_OR_DEPARTMENT_OTHER): Payer: Self-pay

## 2022-12-26 ENCOUNTER — Emergency Department (HOSPITAL_BASED_OUTPATIENT_CLINIC_OR_DEPARTMENT_OTHER)
Admission: EM | Admit: 2022-12-26 | Discharge: 2022-12-26 | Disposition: A | Discharge status: Home / Self Care | Payer: BLUE CROSS/BLUE SHIELD | Attending: Emergency Medicine | Admitting: Emergency Medicine

## 2022-12-26 DIAGNOSIS — H5712 Ocular pain, left eye: Secondary | ICD-10-CM | POA: Diagnosis present

## 2022-12-26 MED ORDER — FLUORESCEIN SODIUM 1 MG OP STRP
1.0000 | ORAL_STRIP | Freq: Once | OPHTHALMIC | Status: DC
  Filled 2022-12-26: qty 1

## 2022-12-26 MED ORDER — TETRACAINE HCL 0.5 % OP SOLN
2.0000 [drp] | Freq: Once | OPHTHALMIC | Status: DC
  Filled 2022-12-26: qty 4

## 2022-12-26 MED ORDER — ERYTHROMYCIN 5 MG/GM OP OINT
TOPICAL_OINTMENT | OPHTHALMIC | 0 refills | Status: DC
  Filled 2022-12-26: qty 3.5, 7d supply, fill #0

## 2022-12-26 NOTE — Discharge Instructions (Addendum)
You were seen in the emergency room today with eye pain.  The pH of your eye is normal, you have no sign of corneal abrasion and no foreign body.   Please continue to flush your eye at home if pain continues.  You can also apply erythromycin ointment for 7 days.  I would like you to follow-up with an eye doctor as soon as possible.  Please call to schedule appointment.  If you have any new or worsening symptoms please return to the emergency room.

## 2022-12-26 NOTE — ED Triage Notes (Signed)
Reported accidentally sprayed pain stain on his eyes while attempting to switch it out, able to rinse some of it off, and still having some pain.

## 2022-12-26 NOTE — ED Notes (Signed)
Pt. Reports he sprayed paint stain in his eye at around 1330 to 1340 today by accident.  Pt. Reports pain in the L eye.  Pt. Repors blurred vision as well in the L eye.

## 2022-12-26 NOTE — ED Provider Notes (Signed)
[place] [facility] Provider Note   CSN: [id number] Arrival date & time: 12/26/22  1348     History  Chief Complaint  Patient presents with   Eye Pain    Scott Meza is a 52 y.o. male with no significant past medical history reporting to the emergency room after he got paint stain in his eye 1 hour prior to arrival.  He flushed his eye out with half a gallon of water after this happened.  He felt like he was able to flush everything out however he continued having some mild burning pain of the left eye and redness.  Patient does not have any change in vision, or blurry vision.  This has never had anything like this in the past. No other associated symptoms. Patient does not wear contacts.   Eye Pain       Home Medications Prior to Admission medications   Medication Sig Start Date End Date Taking? Authorizing Provider  amitriptyline (ELAVIL) 25 MG tablet TAKE 1 TABLET(25 MG) BY MOUTH AT BEDTIME 03/16/22   Rucker, Magdalen Spatz, MD  diclofenac (VOLTAREN) 75 MG EC tablet TAKE 1 TABLET(75 MG) BY MOUTH TWICE DAILY AS NEEDED FOR MILD PAIN OR MODERATE PAIN 03/16/22   Suzan Slick, MD  lidocaine (LIDODERM) 5 % Place 1 patch onto the skin every 12 (twelve) hours. Remove & Discard patch within 12 hours or as directed by MD 02/16/22   Suzan Slick, MD  methocarbamol (ROBAXIN) 500 MG tablet Take 1 tablet (500 mg total) by mouth 3 (three) times daily. 02/16/22   Suzan Slick, MD  omeprazole (PRILOSEC) 20 MG capsule Take 1 capsule (20 mg total) by mouth daily. 03/16/22   Suzan Slick, MD      Allergies    Patient has no known allergies.    Review of Systems   Review of Systems  Eyes:  Positive for pain.    Physical Exam Updated Vital Signs BP 132/88 (BP Location: Left Arm)   Pulse 96   Temp 98.4 F (36.9 C) (Oral)   Resp 18   Ht 6' (1.829 m)   Wt 104.3 kg   SpO2 98%   BMI 31.19 kg/m  Physical Exam Vitals and  nursing note reviewed.  Constitutional:      General: He is not in acute distress.    Appearance: He is not toxic-appearing.  HENT:     Head: Normocephalic and atraumatic.  Eyes:     General: No scleral icterus.    Extraocular Movements: Extraocular movements intact.     Conjunctiva/sclera: Conjunctivae normal.     Pupils: Pupils are equal, round, and reactive to light.     Comments: Erythema of left lower eyelid  Cardiovascular:     Rate and Rhythm: Normal rate and regular rhythm.     Pulses: Normal pulses.     Heart sounds: Normal heart sounds.  Pulmonary:     Effort: Pulmonary effort is normal. No respiratory distress.     Breath sounds: Normal breath sounds.  Abdominal:     General: Abdomen is flat.     Palpations: Abdomen is soft.     Tenderness: There is no abdominal tenderness.  Skin:    General: Skin is warm and dry.     Findings: No lesion.  Neurological:     General: No focal deficit present.     Mental Status: He is alert and oriented to person, place, and time. Mental  status is at baseline.     ED Results / Procedures / Treatments   Labs (all labs ordered are listed, but only abnormal results are displayed) Labs Reviewed - No data to display  EKG None  Radiology No results found.  Procedures Procedures    Medications Ordered in ED Medications - No data to display  ED Course/ Medical Decision Making/ A&P                                 Medical Decision Making Risk Prescription drug management.   This patient presents to the ED for concern of left eye pain, this involves an extensive number of treatment options, and is a complaint that carries with it a high risk of complications and morbidity.  The differential diagnosis includes foreign body, trauma, corneal ulcer, chemical burn      Consultations Obtained:  None    Problem List / ED Course / Critical interventions / Medication management  Patient reporting to emergency room after in  his left eye.  Washouted immediately after.  Continues to have mild burning pain in left eye.  Visual acuity left 20/20, right 20/40. Has not had any change in vision.  ED triage note states left-sided blurry vision however upon speaking to patient reports not had any vision change denies any blurry vision.  pH of this eye is normal, no increase uptake with Fluorescin stain, no FB. Patient does not wear classes of contacts. Given the patient was able to flush eye and no change in vision and normal pH of eye will Send him home with erythromycin ointment and Follow-up to eye doctor.   Reevaluation of the patient after these medicines showed that the patient improved I have reviewed the patients home medicines and have made adjustments as needed   Plan  F/u w/ Optho in 2-3d to ensure resolution of sx.  Patient was given return precautions. Patient stable for discharge at this time.  Patient educated on sx/dx and verbalized understanding of plan. Return to ER w/ new or worsening sx.          Final Clinical Impression(s) / ED Diagnoses Final diagnoses:  None    Rx / DC Orders ED Discharge Orders     None         Smitty Knudsen, PA-C 12/26/22 1549    Virgina Norfolk, DO 12/27/22 1204

## 2023-01-13 ENCOUNTER — Ambulatory Visit (HOSPITAL_COMMUNITY)
Admission: EM | Admit: 2023-01-13 | Discharge: 2023-01-13 | Disposition: A | Discharge status: Home / Self Care | Payer: Medicaid Other | Attending: Physician Assistant | Admitting: Physician Assistant

## 2023-01-13 ENCOUNTER — Ambulatory Visit (INDEPENDENT_AMBULATORY_CARE_PROVIDER_SITE_OTHER): Payer: Medicaid Other

## 2023-01-13 ENCOUNTER — Encounter (HOSPITAL_COMMUNITY): Payer: Self-pay

## 2023-01-13 DIAGNOSIS — M5412 Radiculopathy, cervical region: Secondary | ICD-10-CM

## 2023-01-13 DIAGNOSIS — G8929 Other chronic pain: Secondary | ICD-10-CM | POA: Diagnosis not present

## 2023-01-13 DIAGNOSIS — M542 Cervicalgia: Secondary | ICD-10-CM

## 2023-01-13 MED ORDER — PREDNISONE 10 MG (21) PO TBPK: ORAL_TABLET | ORAL | 0 refills | Status: DC

## 2023-01-13 MED ORDER — GABAPENTIN 300 MG PO CAPS: 300.0000 mg | ORAL_CAPSULE | Freq: Every day | ORAL | 0 refills | Status: DC

## 2023-01-13 MED ORDER — METHOCARBAMOL 500 MG PO TABS: 500.0000 mg | ORAL_TABLET | Freq: Two times a day (BID) | ORAL | 0 refills | Status: DC

## 2023-01-13 MED ORDER — LIDOCAINE 5 % EX PTCH: 1.0000 | MEDICATED_PATCH | CUTANEOUS | 0 refills | Status: DC

## 2023-01-13 NOTE — Discharge Instructions (Addendum)
Your x-ray does show arthritis and straightening of your neck.  If the radiologist sees something else I will contact you.  I have placed a referral to neurosurgery but as we discussed we do not have someone to work this.  Call them to see if they will allow you to schedule an appointment.  Start prednisone for 1 week.  Do not take NSAIDs with this medication including aspirin, ibuprofen/Advil, naproxen/Aleve.  You can use Tylenol as needed for additional pain relief.  Apply lidocaine patches for symptom relief.  Use these during the day but then remove them at night.  Take Robaxin up to twice a day.  This will make you sleepy so do not drive drink alcohol taking it.  Take gabapentin at night to help with the numbness and tingling in your hands.  If you have any worsening or changing symptoms including increasing pain, weakness in your arms you need to be seen immediately.

## 2023-01-13 NOTE — ED Triage Notes (Signed)
Reports bilateral neck and right side arm pain. This is recurrent problem for patient. Pt reports some arm numbness and tingling.
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
## 2023-01-17 ENCOUNTER — Ambulatory Visit: Payer: BLUE CROSS/BLUE SHIELD | Admitting: Physician Assistant

## 2023-01-17 ENCOUNTER — Encounter: Payer: Self-pay | Admitting: Physician Assistant

## 2023-01-17 VITALS — BP 136/90 | HR 89 | Temp 98.4°F | Resp 18 | Ht 72.0 in | Wt 232.8 lb

## 2023-01-17 DIAGNOSIS — R351 Nocturia: Secondary | ICD-10-CM

## 2023-01-17 DIAGNOSIS — M5412 Radiculopathy, cervical region: Secondary | ICD-10-CM | POA: Diagnosis not present

## 2023-01-17 DIAGNOSIS — H538 Other visual disturbances: Secondary | ICD-10-CM

## 2023-01-17 DIAGNOSIS — K625 Hemorrhage of anus and rectum: Secondary | ICD-10-CM | POA: Diagnosis not present

## 2023-01-17 DIAGNOSIS — B182 Chronic viral hepatitis C: Secondary | ICD-10-CM | POA: Diagnosis not present

## 2023-01-17 LAB — POCT URINALYSIS DIP (MANUAL ENTRY)
Bilirubin, UA: NEGATIVE
Blood, UA: NEGATIVE
Glucose, UA: NEGATIVE mg/dL
Ketones, POC UA: NEGATIVE mg/dL
Leukocytes, UA: NEGATIVE
Nitrite, UA: NEGATIVE
Protein Ur, POC: NEGATIVE mg/dL
Spec Grav, UA: >=1.03 — AB (ref 1.010–1.025)
Urobilinogen, UA: 0.2 U/dL
pH, UA: 6 (ref 5.0–8.0)

## 2023-01-17 NOTE — Progress Notes (Signed)
New patient visit   Patient: Scott Meza   DOB: [birth year]   52 y.o. Male  MRN: [id number] Visit Date: 01/17/2023  Today's healthcare provider: Alfredia Ferguson, PA-C   Chief Complaint  Patient presents with   New Patient (Initial Visit)    Pt seen in the ER recently for neck pain. Pt would like a referral to Neuro in [place].   Subjective    Scott Meza is a 52 y.o. male who presents today as a new patient to establish care.   Pt was seen at urgent care 01/13/23 for cervical radiculopathy, placed on gabapentin, lidocaine patches and a prednisone pack. Xray of cervical spine with multilevel degenerative changes, worst at C5-C6, C6-C7 Discussed the use of AI scribe software for clinical note transcription with the patient, who gave verbal consent to proceed.  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 He reports neck pain and tension, with bilateral radiating numbness and pain down his arms. The pain has been worsening recently due to the nature of their work, which involves a lot of overhead work. The patient has considered surgery in the past but has been hesitant due to the invasive nature of the proposed procedures. The patient reports that the pain is now worse than before. He historically saw [facility], reports history of MRI and injections through pain management. He is currently taking a muscle relaxant and gabapentin which he reports is minimally helpful. He is interested in surgical intervention, not more medication.  Pt also reports an increase in nocturia, some frequency during the day.  The patient also mentions a history of Hepatitis C denies following through with treatment.  He reports a history of a minor surgery for a "blood clot in the anal cavity", and since then he has consistent rectal bleeding with bowel movements.   He reports occasional headache and  blurred vision.  Past Medical History:  Diagnosis Date   Anxiety    Cervical radiculopathy    Past Surgical History:  Procedure Laterality Date   CARPAL TUNNEL RELEASE Right    ELBOW ARTHROSCOPY Right    HEMORROIDECTOMY     No family status information on file.   Family History  Adopted: Yes   Social History   Socioeconomic History   Marital status: Single    Spouse name: Not on file   Number of children: Not on file   Years of education: Not on file   Highest education level: Not on file  Occupational History   Not on file  Tobacco Use   Smoking status: Former   Smokeless tobacco: Former    Types: Chew  Substance and Sexual Activity   Alcohol use: Not Currently    Comment: 2 weeks ago drank twice    Drug use: Not Currently    Types: Marijuana    Comment: smoked a marijuna joint last week.    Sexual activity: Yes    Partners: Female  Other Topics Concern   Not on file  Social History Narrative   Not on file   Social Determinants of Health   Financial Resource Strain: High Risk (09/07/2021)   Received from [facility], [facility]   Overall Financial Resource Strain (CARDIA)    Difficulty of Paying Living Expenses: Very hard  Food Insecurity: Food Insecurity Present (09/07/2021)   Received from [facility], [facility]   Hunger Vital Sign    Worried  About Running Out of Food in the Last Year: Often true    Ran Out of Food in the Last Year: Often true  Transportation Needs: No Transportation Needs (09/07/2021)   Received from [facility], [facility]   PRAPARE - Transportation    Lack of Transportation (Medical): No    Lack of Transportation (Non-Medical): No  Physical Activity: Sufficiently Active (09/07/2021)   Received from [facility], [facility]   Exercise Vital Sign    Days of Exercise per Week: 2 days    Minutes of Exercise per Session: 80 min  Stress: Stress Concern Present (09/07/2021)   Received from [facility], [facility] Occupational [facility] - Occupational Stress Questionnaire    Feeling of Stress : Very much  Social Connections: Unknown (09/12/2022)   Received from [facility]   Social Network    Social Network: Not on file   Outpatient Medications Prior to Visit  Medication Sig   gabapentin (NEURONTIN) 300 MG capsule Take 1 capsule (300 mg total) by mouth at bedtime.   lidocaine (LIDODERM) 5 % Place 1 patch onto the skin daily. Remove & Discard patch within 12 hours or as directed by MD   methocarbamol (ROBAXIN) 500 MG tablet Take 1 tablet (500 mg total) by mouth 2 (two) times daily.   predniSONE (STERAPRED UNI-PAK 21 TAB) 10 MG (21) TBPK tablet As directed   No facility-administered medications prior to visit.   No Known Allergies  Immunization History  Administered Date(s) Administered   Tdap 03/26/2015    Health Maintenance  Topic Date Due   Zoster Vaccines- Shingrix (1 of 2) Never done   INFLUENZA VACCINE  Never done   COVID-19 Vaccine (1 - 2023-24 season) Never done   Colonoscopy  02/17/2023 (Originally 05/30/2016)   DTaP/Tdap/Td (2 - Td or Tdap) 03/25/2025   Hepatitis C Screening  Completed   HIV Screening  Completed   HPV VACCINES  Aged Out    Patient Care Team: Alfredia Ferguson, PA-C as PCP - General (Physician Assistant)  Review of Systems  Constitutional:  Negative for fatigue and fever.  Eyes:  Positive for visual disturbance.  Respiratory:  Negative for cough and shortness of breath.   Cardiovascular:  Negative for chest pain, palpitations and leg swelling.  Gastrointestinal:  Positive for blood in stool and rectal pain.  Musculoskeletal:  Positive for neck pain and neck stiffness.  Neurological:  Negative for dizziness and headaches.       Objective    BP (!) 136/90   Pulse 89   Temp 98.4 F (36.9 C) (Oral)   Resp 18   Ht 6' (1.829 m)   Wt 232 lb 12.8 oz (105.6 kg)   SpO2 96%   BMI 31.57 kg/m    Physical Exam Constitutional:       General: He is awake.     Appearance: He is well-developed.  HENT:     Head: Normocephalic.  Eyes:     Conjunctiva/sclera: Conjunctivae normal.  Cardiovascular:     Rate and Rhythm: Normal rate and regular rhythm.     Heart sounds: Normal heart sounds.  Pulmonary:     Effort: Pulmonary effort is normal.     Breath sounds: Normal breath sounds.  Skin:    General: Skin is warm.  Neurological:     Mental Status: He is alert and oriented to person, place, and time.  Psychiatric:        Attention and Perception: Attention  normal.        Mood and Affect: Mood normal.        Speech: Speech normal.        Behavior: Behavior is cooperative.     Depression Screen    01/24/2022   11:37 AM 07/20/2020   11:20 AM  PHQ 2/9 Scores  PHQ - 2 Score 1   PHQ- 9 Score 10      Information is confidential and restricted. Go to Review Flowsheets to unlock data.   Results for orders placed or performed in visit on 01/17/23  POCT urinalysis dipstick  Result Value Ref Range   Color, UA yellow yellow   Clarity, UA clear clear   Glucose, UA negative negative mg/dL   Bilirubin, UA negative negative   Ketones, POC UA negative negative mg/dL   Spec Grav, UA >=5.409 (A) 1.010 - 1.025   Blood, UA negative negative   pH, UA 6.0 5.0 - 8.0   Protein Ur, POC negative negative mg/dL   Urobilinogen, UA 0.2 0.2 or 1.0 E.U./dL   Nitrite, UA Negative Negative   Leukocytes, UA Negative Negative    Assessment & Plan     Cervical radiculopathy Assessment & Plan: Gabapentin provides some relief for arm tingling but not neck spasms. Significant functional impairment, especially with overhead tasks.  Will obtain prior records/MRI Referring to neurosurgery per pt preference Referring to physical therapy. Cont gabapentin 300 mg qhs  Orders: -     Ambulatory referral to Neurosurgery -     Ambulatory referral to Physical Therapy  Chronic hepatitis C without hepatic coma (HCC) Assessment & Plan: Chronic  Hepatitis C with previous unsuccessful treatments. No current symptoms, Discussed importance of follow-up with infectious disease for further management. - Refer to infectious disease for further management and treatment options - Order liver function tests  Orders: -     Comprehensive metabolic panel -     Ambulatory referral to Infectious Disease  Nocturia Assessment & Plan:  Increased nocturnal urination frequency, possibly related to medication or other underlying conditions. No associated pain or discomfort reported. -order A1c, psa, UA neg leuks, blood  Orders: -     PSA -     POCT urinalysis dipstick -     Hemoglobin A1c  Rectal bleeding Will check cbc Ref to GI, sounds like hemorrhoidectomy was the initial procedure -     CBC with Differential/Platelet -     Ambulatory referral to Gastroenterology  Blurred vision Pt requesting referring to optho -     Ambulatory referral to Ophthalmology   General Health Maintenance Pt declines vaccines today    Return if symptoms worsen or fail to improve.      Alfredia Ferguson, PA-C  [facility] at [facility] [phone] (phone) [phone] (fax)  [facility]

## 2023-01-17 NOTE — Assessment & Plan Note (Addendum)
  Increased nocturnal urination frequency, possibly related to medication or other underlying conditions. No associated pain or discomfort reported. -order A1c, psa, UA neg leuks, blood

## 2023-01-17 NOTE — Assessment & Plan Note (Signed)
Gabapentin provides some relief for arm tingling but not neck spasms. Significant functional impairment, especially with overhead tasks.  Will obtain prior records/MRI Referring to neurosurgery per pt preference Referring to physical therapy. Cont gabapentin 300 mg qhs

## 2023-01-17 NOTE — Assessment & Plan Note (Signed)
Chronic Hepatitis C with previous unsuccessful treatments. No current symptoms, Discussed importance of follow-up with infectious disease for further management. - Refer to infectious disease for further management and treatment options - Order liver function tests

## 2023-01-17 NOTE — Patient Instructions (Addendum)
Ophthalmology - [facility]  [address]  [place], [place] [address] [phone]

## 2023-01-18 LAB — CBC WITH DIFFERENTIAL/PLATELET
Basophils Absolute: 0.2 10*3/uL — ABNORMAL HIGH (ref 0.0–0.1)
Basophils Relative: 1.6 % (ref 0.0–3.0)
Eosinophils Absolute: 0.1 10*3/uL (ref 0.0–0.7)
Eosinophils Relative: 0.9 % (ref 0.0–5.0)
HCT: 50.5 % (ref 39.0–52.0)
Hemoglobin: 16.6 g/dL (ref 13.0–17.0)
Lymphocytes Relative: 26.3 % (ref 12.0–46.0)
Lymphs Abs: 3.9 10*3/uL (ref 0.7–4.0)
MCHC: 32.9 g/dL (ref 30.0–36.0)
MCV: 94.8 fL (ref 78.0–100.0)
Monocytes Absolute: 1.4 10*3/uL — ABNORMAL HIGH (ref 0.1–1.0)
Monocytes Relative: 9.2 % (ref 3.0–12.0)
Neutro Abs: 9.2 10*3/uL — ABNORMAL HIGH (ref 1.4–7.7)
Neutrophils Relative %: 62 % (ref 43.0–77.0)
Platelets: 233 10*3/uL (ref 150.0–400.0)
RBC: 5.33 Mil/uL (ref 4.22–5.81)
RDW: 12.5 % (ref 11.5–15.5)
WBC: 14.8 10*3/uL — ABNORMAL HIGH (ref 4.0–10.5)

## 2023-01-18 LAB — COMPREHENSIVE METABOLIC PANEL
ALT: 56 U/L — ABNORMAL HIGH (ref 0–53)
AST: 26 U/L (ref 0–37)
Albumin: 4.7 g/dL (ref 3.5–5.2)
Alkaline Phosphatase: 71 U/L (ref 39–117)
BUN: 13 mg/dL (ref 6–23)
CO2: 33 meq/L — ABNORMAL HIGH (ref 19–32)
Calcium: 9.8 mg/dL (ref 8.4–10.5)
Chloride: 102 meq/L (ref 96–112)
Creatinine, Ser: 0.98 mg/dL (ref 0.40–1.50)
GFR: 89.2 mL/min (ref >60.00)
Glucose, Bld: 67 mg/dL — ABNORMAL LOW (ref 70–99)
Potassium: 4.5 meq/L (ref 3.5–5.1)
Sodium: 142 meq/L (ref 135–145)
Total Bilirubin: 0.5 mg/dL (ref 0.2–1.2)
Total Protein: 6.9 g/dL (ref 6.0–8.3)

## 2023-01-18 LAB — PSA: PSA: 0.81 ng/mL (ref 0.10–4.00)

## 2023-01-18 LAB — HEMOGLOBIN A1C: Hgb A1c MFr Bld: 5.6 % (ref 4.6–6.5)

## 2023-01-19 ENCOUNTER — Other Ambulatory Visit: Payer: Self-pay | Admitting: Physician Assistant

## 2023-01-19 ENCOUNTER — Telehealth: Payer: Self-pay

## 2023-01-19 ENCOUNTER — Other Ambulatory Visit (HOSPITAL_COMMUNITY): Payer: Self-pay

## 2023-01-19 DIAGNOSIS — D72829 Elevated white blood cell count, unspecified: Secondary | ICD-10-CM

## 2023-01-19 NOTE — Telephone Encounter (Signed)
[facility] Patient Product/process development scientist completed.    The patient is insured through [employer] [facility].  Medication will need a PA.  We will continue to follow to see if copay assistance is needed.  Clearance Coots, CPhT Specialty Pharmacy Patient [facility] Phone: [phone] Fax:  [phone]

## 2023-01-20 ENCOUNTER — Encounter: Payer: Self-pay | Admitting: Physician Assistant

## 2023-01-20 ENCOUNTER — Ambulatory Visit (INDEPENDENT_AMBULATORY_CARE_PROVIDER_SITE_OTHER): Payer: BLUE CROSS/BLUE SHIELD | Admitting: Physician Assistant

## 2023-01-20 VITALS — BP 152/88 | HR 84 | Temp 98.0°F | Ht 72.0 in | Wt 237.4 lb

## 2023-01-20 DIAGNOSIS — M5412 Radiculopathy, cervical region: Secondary | ICD-10-CM | POA: Diagnosis not present

## 2023-01-20 DIAGNOSIS — M546 Pain in thoracic spine: Secondary | ICD-10-CM

## 2023-01-20 MED ORDER — OXYCODONE HCL 5 MG PO TABS: 5.0000 mg | ORAL_TABLET | ORAL | 0 refills | Status: DC | PRN

## 2023-01-20 NOTE — Progress Notes (Signed)
Established patient visit   Patient: Scott Meza   DOB: [birth year]   52 y.o. Male  MRN: [id number] Visit Date: 01/20/2023  Today's healthcare provider: Alfredia Ferguson, PA-C   Cc. Cervical radiculopathy  Subjective     Pt presents today with acute worsening of cervical pain -- radiating down L arm w/ tingling, into his mid back. Causing headache with light sensitivity and some blurred vision -- but pt unsure if blurred vision is chronic 2/2 to needing glasses.  Denies chest pain, diaphoresis, nausea.  Medications: Outpatient Medications Prior to Visit  Medication Sig   gabapentin (NEURONTIN) 300 MG capsule Take 1 capsule (300 mg total) by mouth at bedtime.   lidocaine (LIDODERM) 5 % Place 1 patch onto the skin daily. Remove & Discard patch within 12 hours or as directed by MD   methocarbamol (ROBAXIN) 500 MG tablet Take 1 tablet (500 mg total) by mouth 2 (two) times daily.   [DISCONTINUED] predniSONE (STERAPRED UNI-PAK 21 TAB) 10 MG (21) TBPK tablet As directed   No facility-administered medications prior to visit.    Review of Systems  Constitutional:  Negative for fatigue and fever.  Respiratory:  Negative for cough and shortness of breath.   Cardiovascular:  Negative for chest pain, palpitations and leg swelling.  Musculoskeletal:  Positive for back pain and neck pain.  Neurological:  Positive for numbness. Negative for dizziness and headaches.       Objective    BP (!) 152/88 (BP Location: Right Arm, Patient Position: Sitting, Cuff Size: Normal)   Pulse 84   Temp 98 F (36.7 C) (Oral)   Ht 6' (1.829 m)   Wt 237 lb 6 oz (107.7 kg)   SpO2 96%   BMI 32.19 kg/m    Physical Exam Vitals reviewed.  Constitutional:      Appearance: He is not ill-appearing.  HENT:     Head: Normocephalic.  Eyes:     Conjunctiva/sclera: Conjunctivae normal.  Cardiovascular:     Rate and Rhythm: Normal rate.  Pulmonary:     Effort: Pulmonary effort is normal. No  respiratory distress.  Musculoskeletal:     Comments: Tenderness to L trap, tension to neck, thoracic spine  Neurological:     General: No focal deficit present.     Mental Status: He is alert and oriented to person, place, and time.  Psychiatric:        Mood and Affect: Mood normal.        Behavior: Behavior normal.      No results found for any visits on 01/20/23.  Assessment & Plan    Cervical radiculopathy -     oxyCODONE HCl; Take 1 tablet (5 mg total) by mouth every 4 (four) hours as needed for severe pain (pain score 7-10).  Dispense: 30 tablet; Refill: 0  Acute bilateral thoracic back pain -     EKG 12-Lead  Given severity, radiation to L arm and back, ordered EKG today. EKG normal sinus with no ST changes, T wave abnormalities   Pt reported history of tramadol, toradol use with no improvement.  Will send in a few days of oxycodone 5 mg IR . Ok to take ibuprofen or alleve otc, but pt to avoid tyelnol 2/2 hep c. Pt has appt scheduled w/ PT  Return if symptoms worsen or fail to improve.       Alfredia Ferguson, PA-C  [facility] at [facility] [phone] (phone) [phone] (  fax)  [facility]

## 2023-01-20 NOTE — Patient Instructions (Addendum)
Scott Meza physical therapy:  [phone]

## 2023-01-31 ENCOUNTER — Ambulatory Visit: Payer: BLUE CROSS/BLUE SHIELD | Admitting: Infectious Diseases

## 2023-02-01 ENCOUNTER — Ambulatory Visit: Payer: BLUE CROSS/BLUE SHIELD | Admitting: Gastroenterology

## 2023-02-01 NOTE — Therapy (Signed)
OUTPATIENT PHYSICAL THERAPY CERVICAL EVALUATION   Patient Name: Scott Meza MRN: [id number] DOB:December 04, 1970, 52 y.o., male Today's Date: 02/02/2023  END OF SESSION:  PT End of Session - 02/02/23 0806     Visit Number 1    Authorization Type BCBS    PT Start Time 0806    PT Stop Time 0850    PT Time Calculation (min) 44 min             Past Medical History:  Diagnosis Date   Anxiety    Cervical radiculopathy    Past Surgical History:  Procedure Laterality Date   CARPAL TUNNEL RELEASE Right    ELBOW ARTHROSCOPY Right    HEMORROIDECTOMY     Patient Active Problem List   Diagnosis Date Noted   Nocturia 01/17/2023   Chronic hepatitis C without hepatic coma (HCC) 01/17/2023   Cervical radiculopathy 04/06/2021   OA (osteoarthritis) of knee 08/07/2020   Bipolar 2 disorder, major depressive episode (HCC) 07/20/2020   GAD (generalized anxiety disorder) 07/20/2020   PTSD (post-traumatic stress disorder) 07/20/2020    PCP: Alfredia Ferguson, PA-C   REFERRING PROVIDER: Alfredia Ferguson, PA-C  REFERRING DIAG: [phone] (ICD-10-CM) - Cervical radiculopathy   THERAPY DIAG:  Radiculopathy, cervical region  Cramp and spasm  Cervicalgia  Rationale for Evaluation and Treatment: Rehabilitation  ONSET DATE: 01/13/23 seen at urgent care  SUBJECTIVE:                                                                                                                                                                                                         SUBJECTIVE STATEMENT: Hurting a lot, don't like to take the oxy too much, just if can't sleep, just trying to get the process going, before neurosurgeon tells me I have to go through PT, was going to do surgery before through ortho and decided wanted to go through neurosurgeon then ended up moving to [place] and came back.  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 He reports neck pain and tension, with bilateral radiating numbness and pain down his arms. The pain has been worsening recently due to the nature of his work, which involves a lot of overhead work. The patient has considered surgery in the past but has been hesitant due to the invasive nature of the proposed procedures. The patient reports that the pain is now worse than before. He historically saw [facility], reports history of MRI and injections  through pain management. He is currently taking a muscle relaxant and gabapentin which he reports is minimally helpful. He is interested in surgical intervention, not more medication."  PMH: Bipolar, PTSD, chronic neck pain, chronic hep C., R carpal tunnel release, R elbow arthroscopy  PAIN:  Are you having pain? Yes: NPRS scale: 9/10 Pain location: base of neck radiating down to R shoulder, sometimes goes down shoulder blades and arms Pain description: feels like neck is in a clamp, numbness and tingling in hands Aggravating factors: overhead movements Relieving factors: heat, medication, ibuprofen  PRECAUTIONS: None  RED FLAGS: None     WEIGHT BEARING RESTRICTIONS: No  FALLS:  Has patient fallen in last 6 months? No  LIVING ENVIRONMENT: Lives with: lives with their family Lives in: House/apartment Stairs: No Has following equipment at home: None  OCCUPATION: Holiday representative  PLOF: Independent  PATIENT GOALS: feel better  NEXT MD VISIT: not yet scheduled   OBJECTIVE:   DIAGNOSTIC FINDINGS:  01/13/2023 Xray of cervical spine with multilevel degenerative changes, worst at C5-C6, C6-C7  MRI not available for review.   PATIENT SURVEYS:  NDI 25/50  COGNITION: Overall cognitive status: Within functional limits for tasks assessed  SENSATION: Light touch: WFL Reports intermittant tingling in bil hands  POSTURE:  decreased cervical  lordosis  PALPATION: Tenderness/tightness throughout cervical paraspinals, UT, levator scapulae mm.  Decreased mobility of cervical spine throughout.    CERVICAL ROM:   Active ROM A/PROM (deg) eval  Flexion 17  Extension 21  Right lateral flexion   Left lateral flexion   Right rotation 27  Left rotation 32   (Blank rows = not tested)  UPPER EXTREMITY ROM:  Active ROM Right eval Left eval  Shoulder flexion    Shoulder extension    Shoulder internal rotation    Shoulder external rotation    Elbow flexion    Elbow extension     (Blank rows = not tested)  UPPER EXTREMITY MMT:  MMT Right* eval Left* eval  Shoulder flexion 4+ 4+  Shoulder extension    Shoulder abduction 4+ 4+  Shoulder internal rotation 4+ 4+  Shoulder external rotation 4+ 4+  Elbow flexion 4+ 4+  Elbow extension 4+ 4+  Wrist flexion 4+ 4+  Wrist extension 4+ 4+  Grip strength 85 105   (Blank rows = not tested) *submax due to pain  CERVICAL SPECIAL TESTS:  Spurling's test: Positive, Distraction test: Positive, and Sharp pursor's test: Negative  TODAY'S TREATMENT:                                                                                                                              DATE:   02/02/23 EVAL Modalities: TENS to bil UT + MHP x 10 min  information provided on inexpensive TENS unit available via [employer]   PATIENT EDUCATION:  Education details: findings, POC, information on TENS Person educated: Patient Education method: Explanation and Handouts Education comprehension: verbalized understanding  HOME  EXERCISE PROGRAM: TBD  ASSESSMENT:  CLINICAL IMPRESSION: Patient is a 52 y.o. right hand dominant male who was seen today for physical therapy evaluation and treatment for cervical radiculopathy.  He demonstrates pain and muscle spasms limiting cervical ROM, activity tolerance, causing headache, limiting sleep and impacting quality of life.  NDI was 25/50 demonstrating severe  disability due to neck pain.  Zed Kanaley Erby would benefit from skilled physical therapy to decrease pain and improve activity tolerance.  Today we discussed different interventions, including traction, TrDN, and TENS, in addition to exercises and manual therapy to help modulate pain, trialed TENS + MHP to decrease pain which helped, and provided him information on inexpensive TENS unit that could be purchased on [employer].   OBJECTIVE IMPAIRMENTS: decreased activity tolerance, decreased endurance, decreased ROM, decreased strength, hypomobility, increased fascial restrictions, impaired perceived functional ability, increased muscle spasms, impaired sensation, impaired UE functional use, and pain.   ACTIVITY LIMITATIONS: carrying, lifting, bending, sitting, squatting, sleeping, bed mobility, reach over head, and hygiene/grooming  PARTICIPATION LIMITATIONS: meal prep, cleaning, laundry, driving, shopping, community activity, occupation, and yard work  PERSONAL FACTORS: Time since onset of injury/illness/exacerbation and 3+ comorbidities: Bipolar, PTSD, chronic neck pain, chronic hep C.,history of R carpal tunnel release, R elbow arthroscopy  are also affecting patient's functional outcome.   REHAB POTENTIAL: Good  CLINICAL DECISION MAKING: Evolving/moderate complexity  EVALUATION COMPLEXITY: Moderate   GOALS: Goals reviewed with patient? Yes  SHORT TERM GOALS: Target date: 02/23/2023   Patient will be independent with initial HEP.  Baseline: needs Goal status: INITIAL  LONG TERM GOALS: Target date: 03/16/2023   Patient will be independent with advanced/ongoing HEP to improve outcomes and carryover.  Baseline:  Goal status: INITIAL  2.  Patient will report 75% improvement in neck pain to improve QOL.  Baseline:  Goal status: INITIAL  3.  Patient will demonstrate full pain free cervical ROM for safety with driving.  Baseline: see objective Goal status: INITIAL  4.  Patient  will report at least 8 points improvement on NDI to demonstrate improved functional ability.  Baseline: 25/50 Goal status: INITIAL  5.  Patient will demonstrate 75% improvement in radicular symptoms.  Baseline: numbness and tingling radiate down to both hands Goal status: INITIAL  PLAN:  PT FREQUENCY: 1-2x/week  PT DURATION: 6 weeks  PLANNED INTERVENTIONS: 97164- PT Re-evaluation, 97110-Therapeutic exercises, 97530- Therapeutic activity, 97112- Neuromuscular re-education, 97535- Self Care, 16109- Manual therapy, 97014- Electrical stimulation (unattended), [id number]- Electrical stimulation (manual), [id number]- Ultrasound, 60454- Traction (mechanical), Patient/Family education, Balance training, Taping, Dry Needling, Joint mobilization, Joint manipulation, Spinal manipulation, Spinal mobilization, Cryotherapy, and Moist heat  PLAN FOR NEXT SESSION: HEP, focus on pain modulation to start, modalites, try cervical traction, healthy blue cannot bill traction, estim.    Jena Gauss, PT, DPT 02/02/2023, 10:02 AM

## 2023-02-02 ENCOUNTER — Other Ambulatory Visit: Payer: Self-pay | Admitting: Physician Assistant

## 2023-02-02 ENCOUNTER — Other Ambulatory Visit: Payer: Self-pay

## 2023-02-02 ENCOUNTER — Encounter: Payer: Self-pay | Admitting: Physical Therapy

## 2023-02-02 ENCOUNTER — Ambulatory Visit: Payer: BLUE CROSS/BLUE SHIELD | Attending: Physician Assistant | Admitting: Physical Therapy

## 2023-02-02 ENCOUNTER — Telehealth: Payer: Self-pay | Admitting: Physician Assistant

## 2023-02-02 ENCOUNTER — Other Ambulatory Visit (INDEPENDENT_AMBULATORY_CARE_PROVIDER_SITE_OTHER): Payer: BLUE CROSS/BLUE SHIELD

## 2023-02-02 DIAGNOSIS — M542 Cervicalgia: Secondary | ICD-10-CM | POA: Diagnosis present

## 2023-02-02 DIAGNOSIS — R252 Cramp and spasm: Secondary | ICD-10-CM | POA: Insufficient documentation

## 2023-02-02 DIAGNOSIS — M5412 Radiculopathy, cervical region: Secondary | ICD-10-CM | POA: Insufficient documentation

## 2023-02-02 DIAGNOSIS — D72829 Elevated white blood cell count, unspecified: Secondary | ICD-10-CM | POA: Diagnosis not present

## 2023-02-02 LAB — CBC WITH DIFFERENTIAL/PLATELET
Basophils Absolute: 0.2 10*3/uL — ABNORMAL HIGH (ref 0.0–0.1)
Basophils Relative: 2.4 % (ref 0.0–3.0)
Eosinophils Absolute: 0.3 10*3/uL (ref 0.0–0.7)
Eosinophils Relative: 4.5 % (ref 0.0–5.0)
HCT: 50.9 % (ref 39.0–52.0)
Hemoglobin: 17.5 g/dL — ABNORMAL HIGH (ref 13.0–17.0)
Lymphocytes Relative: 27.3 % (ref 12.0–46.0)
Lymphs Abs: 1.9 10*3/uL (ref 0.7–4.0)
MCHC: 34.3 g/dL (ref 30.0–36.0)
MCV: 94.6 fL (ref 78.0–100.0)
Monocytes Absolute: 0.7 10*3/uL (ref 0.1–1.0)
Monocytes Relative: 10.6 % (ref 3.0–12.0)
Neutro Abs: 3.8 10*3/uL (ref 1.4–7.7)
Neutrophils Relative %: 55.2 % (ref 43.0–77.0)
Platelets: 168 10*3/uL (ref 150.0–400.0)
RBC: 5.38 Mil/uL (ref 4.22–5.81)
RDW: 12.6 % (ref 11.5–15.5)
WBC: 6.9 10*3/uL (ref 4.0–10.5)

## 2023-02-02 MED ORDER — OXYCODONE HCL 5 MG PO TABS: 5.0000 mg | ORAL_TABLET | ORAL | 0 refills | Status: DC | PRN

## 2023-02-02 NOTE — Telephone Encounter (Signed)
Pt came in for appt and asked upon check out for refill on pain medicine prescribed by pcp. Pt states he has three pills left but is having really bad pain in his neck.Pls call pt and advise if refill can be given without appt.

## 2023-02-02 NOTE — Telephone Encounter (Signed)
Pt wanted to let Lillia Abed know that he was able to get an appt on 12/9/ with [facility] at 9 am. He was able to get his cd to take with him. Pt also said he appreciates her sending in the prescription.

## 2023-02-02 NOTE — Telephone Encounter (Signed)
Called and verbalized with patient

## 2023-02-09 ENCOUNTER — Ambulatory Visit: Payer: BLUE CROSS/BLUE SHIELD | Admitting: Physical Therapy

## 2023-02-09 ENCOUNTER — Telehealth: Payer: BLUE CROSS/BLUE SHIELD | Admitting: Physician Assistant

## 2023-02-09 ENCOUNTER — Encounter: Payer: Self-pay | Admitting: Physical Therapy

## 2023-02-09 ENCOUNTER — Encounter: Payer: Self-pay | Admitting: Physician Assistant

## 2023-02-09 DIAGNOSIS — M5412 Radiculopathy, cervical region: Secondary | ICD-10-CM

## 2023-02-09 DIAGNOSIS — M542 Cervicalgia: Secondary | ICD-10-CM

## 2023-02-09 DIAGNOSIS — R252 Cramp and spasm: Secondary | ICD-10-CM

## 2023-02-09 MED ORDER — OXYCODONE HCL 5 MG PO TABS: 5.0000 mg | ORAL_TABLET | ORAL | 0 refills | Status: DC | PRN

## 2023-02-09 NOTE — Progress Notes (Signed)
MyChart Video Visit    Virtual Visit via Video Note   This format is felt to be most appropriate for this patient at this time. Physical exam was limited by quality of the video and audio technology used for the visit.   Patient location: home Provider location: home;[place], [address]. Private office.  I discussed the limitations of evaluation and management by telemedicine and the availability of in person appointments. The patient expressed understanding and agreed to proceed.  Patient: Scott Meza   DOB: 09/30/1970   52 y.o. Male  MRN: [id number] Visit Date: 02/09/2023  Today's healthcare provider: Alfredia Ferguson, PA-C   Cc. Pain management  Subjective    HPI  Pt presents today w/ continued pain 2/2 chronic cervical spine issues. He is following with PT and neurosurgery with pending MRI. The plan, pending the MRI results, will be for surgery.   He reports poor pain management with gabapentin, methocarbamol, lidocaine patches. Oxycodone 5 mg does help keep him functioning.   Medications: Outpatient Medications Prior to Visit  Medication Sig   gabapentin (NEURONTIN) 300 MG capsule Take 1 capsule (300 mg total) by mouth at bedtime.   lidocaine (LIDODERM) 5 % Place 1 patch onto the skin daily. Remove & Discard patch within 12 hours or as directed by MD   methocarbamol (ROBAXIN) 500 MG tablet Take 1 tablet (500 mg total) by mouth 2 (two) times daily.   [DISCONTINUED] oxyCODONE (OXY IR/ROXICODONE) 5 MG immediate release tablet Take 1 tablet (5 mg total) by mouth every 4 (four) hours as needed for severe pain (pain score 7-10).   No facility-administered medications prior to visit.    Review of Systems  Constitutional:  Negative for fatigue and fever.  Respiratory:  Negative for cough and shortness of breath.   Cardiovascular:  Negative for chest pain, palpitations and leg swelling.  Musculoskeletal:  Positive for neck pain and neck stiffness.  Neurological:   Negative for dizziness and headaches.        Objective    There were no vitals taken for this visit.      Physical Exam Constitutional:      Appearance: Normal appearance. He is not ill-appearing.  Neurological:     Mental Status: He is oriented to person, place, and time.  Psychiatric:        Mood and Affect: Mood normal.        Behavior: Behavior normal.        Assessment & Plan     1. Cervical radiculopathy (Primary) We had dicussed if oxycodone use was to be more regular, to refer to pain management.  Pt is not opposed to other forms of pain management. For now, until appt, will continue oxycodone 5 mg IR q 4-6 prn use.   - Ambulatory referral to Pain Clinic - oxyCODONE (OXY IR/ROXICODONE) 5 MG immediate release tablet; Take 1 tablet (5 mg total) by mouth every 4 (four) hours as needed for severe pain (pain score 7-10).  Dispense: 30 tablet; Refill: 0   Return if symptoms worsen or fail to improve.     I discussed the assessment and treatment plan with the patient. The patient was provided an opportunity to ask questions and all were answered. The patient agreed with the plan and demonstrated an understanding of the instructions.   The patient was advised to call back or seek an in-person evaluation if the symptoms worsen or if the condition fails to improve as anticipated.  I provided 7  minutes of non-face-to-face time during this encounter.  Alfredia Ferguson, PA-C [facility] [phone] (phone) [phone] (fax)  [facility]

## 2023-02-09 NOTE — Therapy (Signed)
OUTPATIENT PHYSICAL THERAPY CERVICAL TREATMENT   Patient Name: OLUKAYODE NUZUM MRN: [id number] DOB:18-Sep-1970, 52 y.o., male Today's Date: 02/09/2023  END OF SESSION:  PT End of Session - 02/09/23 0804     Visit Number 2    Authorization Type BCBS    PT Start Time 0803    PT Stop Time 0835   MHP not included in billing   PT Time Calculation (min) 32 min    Activity Tolerance Patient tolerated treatment well    Behavior During Therapy [facility] for tasks assessed/performed              Past Medical History:  Diagnosis Date   Anxiety    Cervical radiculopathy    Past Surgical History:  Procedure Laterality Date   CARPAL TUNNEL RELEASE Right    ELBOW ARTHROSCOPY Right    HEMORROIDECTOMY     Patient Active Problem List   Diagnosis Date Noted   Nocturia 01/17/2023   Chronic hepatitis C without hepatic coma (HCC) 01/17/2023   Cervical radiculopathy 04/06/2021   OA (osteoarthritis) of knee 08/07/2020   Bipolar 2 disorder, major depressive episode (HCC) 07/20/2020   GAD (generalized anxiety disorder) 07/20/2020   PTSD (post-traumatic stress disorder) 07/20/2020    PCP: Alfredia Ferguson, PA-C   REFERRING PROVIDER: Alfredia Ferguson, PA-C  REFERRING DIAG: [phone] (ICD-10-CM) - Cervical radiculopathy   THERAPY DIAG:  Radiculopathy, cervical region  Cramp and spasm  Cervicalgia  Rationale for Evaluation and Treatment: Rehabilitation  ONSET DATE: 01/13/23 seen at urgent care  SUBJECTIVE:                                                                                                                                                                                                         SUBJECTIVE STATEMENT:  Neck is still a big issue for me, having a hard time sleeping. Saw my surgeon recently and they want to do another MRI before deciding on surgery. Just trying to get through the process.     EVAL: Hurting a lot, don't like to take the oxy too much, just if  can't sleep, just trying to get the process going, before neurosurgeon tells me I have to go through PT, was going to do surgery before through ortho and decided wanted to go through neurosurgeon then ended up moving to [place] and came back.  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 He reports neck pain and tension, with bilateral radiating numbness and  pain down his arms. The pain has been worsening recently due to the nature of his work, which involves a lot of overhead work. The patient has considered surgery in the past but has been hesitant due to the invasive nature of the proposed procedures. The patient reports that the pain is now worse than before. He historically saw [facility], reports history of MRI and injections through pain management. He is currently taking a muscle relaxant and gabapentin which he reports is minimally helpful. He is interested in surgical intervention, not more medication."  PMH: Bipolar, PTSD, chronic neck pain, chronic hep C., R carpal tunnel release, R elbow arthroscopy  PAIN:  Are you having pain? Yes: NPRS scale: 8/10 Pain location: varies, halfway down R shoulder  Pain description: pressure, clamping, squeezing  Aggravating factors: overhead movements, lifting weights, looking up  Relieving factors: heat, medication, ibuprofen  PRECAUTIONS: None  RED FLAGS: None     WEIGHT BEARING RESTRICTIONS: No  FALLS:  Has patient fallen in last 6 months? No  LIVING ENVIRONMENT: Lives with: lives with their family Lives in: House/apartment Stairs: No Has following equipment at home: None  OCCUPATION: Holiday representative  PLOF: Independent  PATIENT GOALS: feel better  NEXT MD VISIT: not yet scheduled   OBJECTIVE:   DIAGNOSTIC FINDINGS:  01/13/2023 Xray of cervical spine with multilevel degenerative changes, worst at C5-C6, C6-C7  MRI not  available for review.   PATIENT SURVEYS:  NDI 25/50  COGNITION: Overall cognitive status: Within functional limits for tasks assessed  SENSATION: Light touch: WFL Reports intermittant tingling in bil hands  POSTURE:  decreased cervical lordosis  PALPATION: Tenderness/tightness throughout cervical paraspinals, UT, levator scapulae mm.  Decreased mobility of cervical spine throughout.    CERVICAL ROM:   Active ROM A/PROM (deg) eval  Flexion 17  Extension 21  Right lateral flexion   Left lateral flexion   Right rotation 27  Left rotation 32   (Blank rows = not tested)  UPPER EXTREMITY ROM:  Active ROM Right eval Left eval  Shoulder flexion    Shoulder extension    Shoulder internal rotation    Shoulder external rotation    Elbow flexion    Elbow extension     (Blank rows = not tested)  UPPER EXTREMITY MMT:  MMT Right* eval Left* eval  Shoulder flexion 4+ 4+  Shoulder extension    Shoulder abduction 4+ 4+  Shoulder internal rotation 4+ 4+  Shoulder external rotation 4+ 4+  Elbow flexion 4+ 4+  Elbow extension 4+ 4+  Wrist flexion 4+ 4+  Wrist extension 4+ 4+  Grip strength 85 105   (Blank rows = not tested) *submax due to pain  CERVICAL SPECIAL TESTS:  Spurling's test: Positive, Distraction test: Positive, and Sharp pursor's test: Negative  TODAY'S TREATMENT:  DATE:    02/09/23  TherEx  UBE L2.5 x3 min forward/3 min backward Chin tucks x7 limited by radicular sx  Light upper trap stretches 2x30 seconds Light lateral flexion isometrics x5 Attempted isometric cervical extension and flexion, pain limited   MHP in sitting x7 minutes, good skin condition noted after (not included in billing)   Manual  Manual cervical traction Suboccipital release     02/02/23 EVAL Modalities: TENS to bil UT + MHP x 10 min  information  provided on inexpensive TENS unit available via [employer]   PATIENT EDUCATION:  Education details: findings, POC, information on TENS Person educated: Patient Education method: Explanation and Handouts Education comprehension: verbalized understanding  HOME EXERCISE PROGRAM: TBD  ASSESSMENT:  CLINICAL IMPRESSION:  02/09/23   Pt arrives today doing OK, no major changes since evaluation.  Tried combination of functional exercises and manual work today with OK tolerance. Very pain limited today, had to modify session frequently PRN. Session tolerance poor due to pain levels and radicular symptoms did not assign form HEP due to poor tolerance, did encourage TENS unit at home so we can focus on skilled interventions such as DN and manual work in clinic. Hopefully 2nd MRI will give helpful information but I am concerned based on session tolerance that surgery may be necessary in the end.      EVAL:Patient is a 52 y.o. right hand dominant male who was seen today for physical therapy evaluation and treatment for cervical radiculopathy.  He demonstrates pain and muscle spasms limiting cervical ROM, activity tolerance, causing headache, limiting sleep and impacting quality of life.  NDI was 25/50 demonstrating severe disability due to neck pain.  Charith Willits Mcgrail would benefit from skilled physical therapy to decrease pain and improve activity tolerance.  Today we discussed different interventions, including traction, TrDN, and TENS, in addition to exercises and manual therapy to help modulate pain, trialed TENS + MHP to decrease pain which helped, and provided him information on inexpensive TENS unit that could be purchased on [employer].   OBJECTIVE IMPAIRMENTS: decreased activity tolerance, decreased endurance, decreased ROM, decreased strength, hypomobility, increased fascial restrictions, impaired perceived functional ability, increased muscle spasms, impaired sensation, impaired UE functional  use, and pain.   ACTIVITY LIMITATIONS: carrying, lifting, bending, sitting, squatting, sleeping, bed mobility, reach over head, and hygiene/grooming  PARTICIPATION LIMITATIONS: meal prep, cleaning, laundry, driving, shopping, community activity, occupation, and yard work  PERSONAL FACTORS: Time since onset of injury/illness/exacerbation and 3+ comorbidities: Bipolar, PTSD, chronic neck pain, chronic hep C.,history of R carpal tunnel release, R elbow arthroscopy  are also affecting patient's functional outcome.   REHAB POTENTIAL: Good  CLINICAL DECISION MAKING: Evolving/moderate complexity  EVALUATION COMPLEXITY: Moderate   GOALS: Goals reviewed with patient? Yes  SHORT TERM GOALS: Target date: 02/23/2023   Patient will be independent with initial HEP.  Baseline: needs Goal status: INITIAL  LONG TERM GOALS: Target date: 03/16/2023   Patient will be independent with advanced/ongoing HEP to improve outcomes and carryover.  Baseline:  Goal status: INITIAL  2.  Patient will report 75% improvement in neck pain to improve QOL.  Baseline:  Goal status: INITIAL  3.  Patient will demonstrate full pain free cervical ROM for safety with driving.  Baseline: see objective Goal status: INITIAL  4.  Patient will report at least 8 points improvement on NDI to demonstrate improved functional ability.  Baseline: 25/50 Goal status: INITIAL  5.  Patient will demonstrate 75% improvement in radicular symptoms.  Baseline: numbness  and tingling radiate down to both hands Goal status: INITIAL  PLAN:  PT FREQUENCY: 1-2x/week  PT DURATION: 6 weeks  PLANNED INTERVENTIONS: 97164- PT Re-evaluation, 97110-Therapeutic exercises, 97530- Therapeutic activity, 97112- Neuromuscular re-education, 97535- Self Care, 16109- Manual therapy, 97014- Electrical stimulation (unattended), [id number]- Electrical stimulation (manual), [id number]- Ultrasound, 60454- Traction (mechanical), Patient/Family education, Balance  training, Taping, Dry Needling, Joint mobilization, Joint manipulation, Spinal manipulation, Spinal mobilization, Cryotherapy, and Moist heat  PLAN FOR NEXT SESSION: HEP, focus on pain modulation to start, modalites, try cervical traction, healthy blue cannot bill traction, estim. Monitor cervical radiculopathy/adjust session PRN     Nedra Hai, PT, DPT 02/09/23 8:44 AM

## 2023-02-13 ENCOUNTER — Encounter: Payer: Self-pay | Admitting: Physical Therapy

## 2023-02-13 ENCOUNTER — Ambulatory Visit: Payer: BLUE CROSS/BLUE SHIELD | Admitting: Physical Therapy

## 2023-02-13 DIAGNOSIS — M5412 Radiculopathy, cervical region: Secondary | ICD-10-CM

## 2023-02-13 DIAGNOSIS — R252 Cramp and spasm: Secondary | ICD-10-CM

## 2023-02-13 DIAGNOSIS — M542 Cervicalgia: Secondary | ICD-10-CM

## 2023-02-13 NOTE — Patient Instructions (Signed)

## 2023-02-13 NOTE — Therapy (Signed)
OUTPATIENT PHYSICAL THERAPY CERVICAL TREATMENT   Patient Name: Scott Meza MRN: [id number] DOB:December 05, 1970, 52 y.o., male Today's Date: 02/13/2023  END OF SESSION:  PT End of Session - 02/13/23 1409     Visit Number 3    Authorization Type BCBS    PT Start Time 1407    PT Stop Time 1444    PT Time Calculation (min) 37 min    Activity Tolerance Patient tolerated treatment well    Behavior During Therapy WFL for tasks assessed/performed              Past Medical History:  Diagnosis Date   Anxiety    Cervical radiculopathy    Past Surgical History:  Procedure Laterality Date   CARPAL TUNNEL RELEASE Right    ELBOW ARTHROSCOPY Right    HEMORROIDECTOMY     Patient Active Problem List   Diagnosis Date Noted   Nocturia 01/17/2023   Chronic hepatitis C without hepatic coma (HCC) 01/17/2023   Cervical radiculopathy 04/06/2021   OA (osteoarthritis) of knee 08/07/2020   Bipolar 2 disorder, major depressive episode (HCC) 07/20/2020   GAD (generalized anxiety disorder) 07/20/2020   PTSD (post-traumatic stress disorder) 07/20/2020    PCP: Alfredia Ferguson, PA-C   REFERRING PROVIDER: Alfredia Ferguson, PA-C  REFERRING DIAG: [phone] (ICD-10-CM) - Cervical radiculopathy   THERAPY DIAG:  Radiculopathy, cervical region  Cramp and spasm  Cervicalgia  Rationale for Evaluation and Treatment: Rehabilitation  ONSET DATE: 01/13/23 seen at urgent care  SUBJECTIVE:                                                                                                                                                                                                         SUBJECTIVE STATEMENT: Have an evaluation scheduled on Christmas eve now.  Glad I started the process with PT.  Last visit was ok, didn't feel much different after.   EVAL: Hurting a lot, don't like to take the oxy too much, just if can't sleep, just trying to get the process going, before neurosurgeon tells me  I have to go through PT, was going to do surgery before through ortho and decided wanted to go through neurosurgeon then ended up moving to [place] and came back.  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 He reports neck pain and tension, with bilateral radiating numbness and pain down his arms. The pain has been worsening recently due to the nature of his work, which  involves a lot of overhead work. The patient has considered surgery in the past but has been hesitant due to the invasive nature of the proposed procedures. The patient reports that the pain is now worse than before. He historically saw [facility], reports history of MRI and injections through pain management. He is currently taking a muscle relaxant and gabapentin which he reports is minimally helpful. He is interested in surgical intervention, not more medication."  PMH: Bipolar, PTSD, chronic neck pain, chronic hep C., R carpal tunnel release, R elbow arthroscopy  PAIN:  Are you having pain? Yes: NPRS scale: 8/10 Pain location: varies, halfway down R shoulder  Pain description: pressure, clamping, squeezing  Aggravating factors: overhead movements, lifting weights, looking up  Relieving factors: heat, medication, ibuprofen  PRECAUTIONS: None  RED FLAGS: None     WEIGHT BEARING RESTRICTIONS: No  FALLS:  Has patient fallen in last 6 months? No  LIVING ENVIRONMENT: Lives with: lives with their family Lives in: House/apartment Stairs: No Has following equipment at home: None  OCCUPATION: Holiday representative  PLOF: Independent  PATIENT GOALS: feel better  NEXT MD VISIT: not yet scheduled   OBJECTIVE:   DIAGNOSTIC FINDINGS:  01/13/2023 Xray of cervical spine with multilevel degenerative changes, worst at C5-C6, C6-C7  MRI not available for review.   PATIENT SURVEYS:  NDI 25/50  COGNITION: Overall  cognitive status: Within functional limits for tasks assessed  SENSATION: Light touch: WFL Reports intermittant tingling in bil hands  POSTURE:  decreased cervical lordosis  PALPATION: Tenderness/tightness throughout cervical paraspinals, UT, levator scapulae mm.  Decreased mobility of cervical spine throughout.    CERVICAL ROM:   Active ROM A/PROM (deg) eval  Flexion 17  Extension 21  Right lateral flexion   Left lateral flexion   Right rotation 27  Left rotation 32   (Blank rows = not tested)  UPPER EXTREMITY ROM:  Active ROM Right eval Left eval  Shoulder flexion    Shoulder extension    Shoulder internal rotation    Shoulder external rotation    Elbow flexion    Elbow extension     (Blank rows = not tested)  UPPER EXTREMITY MMT:  MMT Right* eval Left* eval  Shoulder flexion 4+ 4+  Shoulder extension    Shoulder abduction 4+ 4+  Shoulder internal rotation 4+ 4+  Shoulder external rotation 4+ 4+  Elbow flexion 4+ 4+  Elbow extension 4+ 4+  Wrist flexion 4+ 4+  Wrist extension 4+ 4+  Grip strength 85 105   (Blank rows = not tested) *submax due to pain  CERVICAL SPECIAL TESTS:  Spurling's test: Positive, Distraction test: Positive, and Sharp pursor's test: Negative  TODAY'S TREATMENT:                                                                                                                              DATE:   02/13/23 Therapeutic Exercise: to improve strength and mobility.  Demo, verbal  and tactile cues throughout for technique. UBE x 3 min forward - radicular symptoms in LUE Review HEP - move neck in non-painful directions, shrug shoulders if retro shoulder rolls hurt. Manual Therapy: to decrease muscle spasm and pain and improve mobility In prone - STM/TPR to bil UT, L/S, cervical paraspinals, bil rhomboids UPA mobs cervical spine grade 1-2, PA mobs thoracic spine grade 1-2, skilled palpation and monitoring during dry needling. Trigger Point  Dry-Needling  Treatment instructions: Expect mild to moderate muscle soreness. S/S of pneumothorax if dry needled over a lung field, and to seek immediate medical attention should they occur. Patient verbalized understanding of these instructions and education. Patient Consent Given: Yes Education handout provided: Yes Muscles treated: bil UT, bil C4 multifidi Electrical stimulation performed: No Parameters: N/A Treatment response/outcome: Twitch Response Elicited and Palpable Increase in Muscle Length   02/09/23  TherEx  UBE L2.5 x3 min forward/3 min backward Chin tucks x7 limited by radicular sx  Light upper trap stretches 2x30 seconds Light lateral flexion isometrics x5 Attempted isometric cervical extension and flexion, pain limited   MHP in sitting x7 minutes, good skin condition noted after (not included in billing)  Manual Manual cervical traction Suboccipital release  02/02/23 EVAL Modalities: TENS to bil UT + MHP x 10 min  information provided on inexpensive TENS unit available via [employer]   PATIENT EDUCATION:  Education details: TrDN Person educated: Patient Education method: Chief Technology Officer Education comprehension: verbalized understanding  HOME EXERCISE PROGRAM: TBD  ASSESSMENT:  CLINICAL IMPRESSION:  Pt. Again had poor tolerance with exercise, reporting radicular symptoms starting quickly with UBE, so focussed on manual therapy to decrease muscle spasm.  After explanation of DN rational, procedures, outcomes and potential side effects, patient verbalized consent to DN treatment in conjunction with manual STM/DTM and TPR to reduce ttp/muscle tension. Muscles treated as indicated above. DN produced normal response with good twitches elicited resulting in palpable reduction in pain/ttp and muscle tension, with patient noting less pain upon initiation of movement following DN. Pt educated to expect mild to moderate muscle soreness for up to 24-48 hrs and  instructed to continue prescribed home exercise program and current activity level with pt verbalizing understanding of theses instructions.  Scott Meza continues to demonstrate potential for improvement and would benefit from continued skilled therapy to address impairments.      OBJECTIVE IMPAIRMENTS: decreased activity tolerance, decreased endurance, decreased ROM, decreased strength, hypomobility, increased fascial restrictions, impaired perceived functional ability, increased muscle spasms, impaired sensation, impaired UE functional use, and pain.   ACTIVITY LIMITATIONS: carrying, lifting, bending, sitting, squatting, sleeping, bed mobility, reach over head, and hygiene/grooming  PARTICIPATION LIMITATIONS: meal prep, cleaning, laundry, driving, shopping, community activity, occupation, and yard work  PERSONAL FACTORS: Time since onset of injury/illness/exacerbation and 3+ comorbidities: Bipolar, PTSD, chronic neck pain, chronic hep C.,history of R carpal tunnel release, R elbow arthroscopy  are also affecting patient's functional outcome.   REHAB POTENTIAL: Good  CLINICAL DECISION MAKING: Evolving/moderate complexity  EVALUATION COMPLEXITY: Moderate   GOALS: Goals reviewed with patient? Yes  SHORT TERM GOALS: Target date: 02/23/2023   Patient will be independent with initial HEP.  Baseline: needs Goal status: INITIAL  LONG TERM GOALS: Target date: 03/16/2023   Patient will be independent with advanced/ongoing HEP to improve outcomes and carryover.  Baseline:  Goal status: INITIAL  2.  Patient will report 75% improvement in neck pain to improve QOL.  Baseline:  Goal status: INITIAL  3.  Patient will demonstrate  full pain free cervical ROM for safety with driving.  Baseline: see objective Goal status: INITIAL  4.  Patient will report at least 8 points improvement on NDI to demonstrate improved functional ability.  Baseline: 25/50 Goal status: INITIAL  5.   Patient will demonstrate 75% improvement in radicular symptoms.  Baseline: numbness and tingling radiate down to both hands Goal status: INITIAL  PLAN:  PT FREQUENCY: 1-2x/week  PT DURATION: 6 weeks  PLANNED INTERVENTIONS: 97164- PT Re-evaluation, 97110-Therapeutic exercises, 97530- Therapeutic activity, 97112- Neuromuscular re-education, 97535- Self Care, 56387- Manual therapy, 97014- Electrical stimulation (unattended), [id number]- Electrical stimulation (manual), [id number]- Ultrasound, 56433- Traction (mechanical), Patient/Family education, Balance training, Taping, Dry Needling, Joint mobilization, Joint manipulation, Spinal manipulation, Spinal mobilization, Cryotherapy, and Moist heat  PLAN FOR NEXT SESSION: HEP, focus on pain modulation to start, modalites, try cervical traction, healthy blue cannot bill traction, estim. Monitor cervical radiculopathy/adjust session PRN     Jena Gauss, PT, DPT 02/13/23 4:37 PM

## 2023-02-15 ENCOUNTER — Encounter: Payer: BLUE CROSS/BLUE SHIELD | Admitting: Physical Therapy

## 2023-02-17 ENCOUNTER — Telehealth: Payer: Self-pay | Admitting: Physician Assistant

## 2023-02-17 ENCOUNTER — Ambulatory Visit: Payer: BLUE CROSS/BLUE SHIELD | Admitting: Physical Therapy

## 2023-02-17 ENCOUNTER — Encounter: Payer: Self-pay | Admitting: Physical Therapy

## 2023-02-17 ENCOUNTER — Other Ambulatory Visit: Payer: Self-pay | Admitting: Physician Assistant

## 2023-02-17 DIAGNOSIS — M542 Cervicalgia: Secondary | ICD-10-CM

## 2023-02-17 DIAGNOSIS — R252 Cramp and spasm: Secondary | ICD-10-CM

## 2023-02-17 DIAGNOSIS — M5412 Radiculopathy, cervical region: Secondary | ICD-10-CM

## 2023-02-17 MED ORDER — OXYCODONE HCL 5 MG PO TABS: 5.0000 mg | ORAL_TABLET | Freq: Four times a day (QID) | ORAL | 0 refills | Status: DC | PRN

## 2023-02-17 NOTE — Telephone Encounter (Signed)
Pt came in office stating is needing refill on oxyCODONE (OXY IR/ROXICODONE) 5 MG immediate release tablet sent to  [id number] - [place], [place] - [address] AT [facility] OF [address], [place] [address] Phone: [phone]  Fax: [phone] DEA #: [id number]  Pt states was recommended by pcp to call Pain management to get scheduled- pt mentioned called them and they had told pt will be called to get schedule but has not been called yet. (Pt Stated person at Pain management was rude) pt was recommended to call the office again to verify if they can get him schedule ASAP. Pt still would like to get his meds refill since has 1 day left of meds.(Last rx was sent on 02-09-2023) Please advise. Pt tel [phone].

## 2023-02-17 NOTE — Telephone Encounter (Signed)
I think you stated no more refills?   Should we refer him somewhere else?   Please advise

## 2023-02-17 NOTE — Telephone Encounter (Signed)
Called patient and advise. He verbalized understanding.... stated he will call pain management again today and try to get scheduled.

## 2023-02-17 NOTE — Therapy (Signed)
OUTPATIENT PHYSICAL THERAPY CERVICAL TREATMENT   Patient Name: Scott Meza MRN: [id number] DOB:08-15-1970, 52 y.o., male Today's Date: 02/17/2023  END OF SESSION:  PT End of Session - 02/17/23 0845     Visit Number 4    Authorization Type BCBS    PT Start Time 0845    PT Stop Time 0929    PT Time Calculation (min) 44 min    Activity Tolerance Patient tolerated treatment well    Behavior During Therapy [facility] for tasks assessed/performed              Past Medical History:  Diagnosis Date   Anxiety    Cervical radiculopathy    Past Surgical History:  Procedure Laterality Date   CARPAL TUNNEL RELEASE Right    ELBOW ARTHROSCOPY Right    HEMORROIDECTOMY     Patient Active Problem List   Diagnosis Date Noted   Nocturia 01/17/2023   Chronic hepatitis C without hepatic coma (HCC) 01/17/2023   Cervical radiculopathy 04/06/2021   OA (osteoarthritis) of knee 08/07/2020   Bipolar 2 disorder, major depressive episode (HCC) 07/20/2020   GAD (generalized anxiety disorder) 07/20/2020   PTSD (post-traumatic stress disorder) 07/20/2020    PCP: Alfredia Ferguson, PA-C   REFERRING PROVIDER: Alfredia Ferguson, PA-C  REFERRING DIAG: [phone] (ICD-10-CM) - Cervical radiculopathy   THERAPY DIAG:  Radiculopathy, cervical region  Cramp and spasm  Cervicalgia  Rationale for Evaluation and Treatment: Rehabilitation  ONSET DATE: 01/13/23 seen at urgent care  SUBJECTIVE:                                                                                                                                                                                                         SUBJECTIVE STATEMENT: MRI scheduled on the 02/21/23.  Felt a little better after the needling, but has been really sore last couple of days, and numbness in L hand, 1st three fingers.     EVAL: Hurting a lot, don't like to take the oxy too much, just if can't sleep, just trying to get the process going, before  neurosurgeon tells me I have to go through PT, was going to do surgery before through ortho and decided wanted to go through neurosurgeon then ended up moving to [place] and came back.  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 He reports neck pain and tension, with bilateral radiating numbness and pain down his arms. The pain has been worsening recently due to  the nature of his work, which involves a lot of overhead work. The patient has considered surgery in the past but has been hesitant due to the invasive nature of the proposed procedures. The patient reports that the pain is now worse than before. He historically saw [facility], reports history of MRI and injections through pain management. He is currently taking a muscle relaxant and gabapentin which he reports is minimally helpful. He is interested in surgical intervention, not more medication."  PMH: Bipolar, PTSD, chronic neck pain, chronic hep C., R carpal tunnel release, R elbow arthroscopy  PAIN:  Are you having pain? Yes: NPRS scale: 8/10 Pain location: varies, halfway down R shoulder  Pain description: pressure, clamping, squeezing  Aggravating factors: overhead movements, lifting weights, looking up  Relieving factors: heat, medication, ibuprofen  PRECAUTIONS: None  RED FLAGS: None     WEIGHT BEARING RESTRICTIONS: No  FALLS:  Has patient fallen in last 6 months? No  LIVING ENVIRONMENT: Lives with: lives with their family Lives in: House/apartment Stairs: No Has following equipment at home: None  OCCUPATION: Holiday representative  PLOF: Independent  PATIENT GOALS: feel better  NEXT MD VISIT: not yet scheduled   OBJECTIVE:   DIAGNOSTIC FINDINGS:  01/13/2023 Xray of cervical spine with multilevel degenerative changes, worst at C5-C6, C6-C7  MRI not available for review.   PATIENT SURVEYS:  NDI  25/50  COGNITION: Overall cognitive status: Within functional limits for tasks assessed  SENSATION: Light touch: WFL Reports intermittant tingling in bil hands  POSTURE:  decreased cervical lordosis  PALPATION: Tenderness/tightness throughout cervical paraspinals, UT, levator scapulae mm.  Decreased mobility of cervical spine throughout.    CERVICAL ROM:   Active ROM A/PROM (deg) eval  Flexion 17  Extension 21  Right lateral flexion   Left lateral flexion   Right rotation 27  Left rotation 32   (Blank rows = not tested)  UPPER EXTREMITY ROM:  Active ROM Right eval Left eval  Shoulder flexion    Shoulder extension    Shoulder internal rotation    Shoulder external rotation    Elbow flexion    Elbow extension     (Blank rows = not tested)  UPPER EXTREMITY MMT:  MMT Right* eval Left* eval  Shoulder flexion 4+ 4+  Shoulder extension    Shoulder abduction 4+ 4+  Shoulder internal rotation 4+ 4+  Shoulder external rotation 4+ 4+  Elbow flexion 4+ 4+  Elbow extension 4+ 4+  Wrist flexion 4+ 4+  Wrist extension 4+ 4+  Grip strength 85 105   (Blank rows = not tested) *submax due to pain  CERVICAL SPECIAL TESTS:  Spurling's test: Positive, Distraction test: Positive, and Sharp pursor's test: Negative  TODAY'S TREATMENT:                                                                                                                              DATE:   02/17/23 Therapeutic Exercise: to improve  strength and mobility.  Demo, verbal and tactile cues throughout for technique. Radial nerve glides - tried 2 versions, to perform without head movements to decrease irritation when hand numb Gentle chin tuck - mostly isometric contraction Isometric scapular squeeze Self snag with pillow case - flexion and rotation to tolerance.  HEP update Manual Therapy: to decrease muscle spasm and pain and improve mobility In prone - STM/TPR to bil UT, L/S, cervical paraspinals, bil  rhomboids UPA mobs cervical spine grade 1-2, PA mobs thoracic spine grade 1-2, skilled palpation and monitoring during dry needling. Trigger Point Dry-Needling  Treatment instructions: Expect mild to moderate muscle soreness. S/S of pneumothorax if dry needled over a lung field, and to seek immediate medical attention should they occur. Patient verbalized understanding of these instructions and education. Patient Consent Given: Yes Education handout provided: Previously provided Muscles treated: bil UT, bil C4, C5 multifidi Electrical stimulation performed: No Parameters: N/A Treatment response/outcome: Twitch Response Elicited and Palpable Increase in Muscle Length   02/13/23 Therapeutic Exercise: to improve strength and mobility.  Demo, verbal and tactile cues throughout for technique. UBE x 3 min forward - radicular symptoms in LUE Review HEP - move neck in non-painful directions, shrug shoulders if retro shoulder rolls hurt. Manual Therapy: to decrease muscle spasm and pain and improve mobility In prone - STM/TPR to bil UT, L/S, cervical paraspinals, bil rhomboids UPA mobs cervical spine grade 1-2, PA mobs thoracic spine grade 1-2, skilled palpation and monitoring during dry needling. Trigger Point Dry-Needling  Treatment instructions: Expect mild to moderate muscle soreness. S/S of pneumothorax if dry needled over a lung field, and to seek immediate medical attention should they occur. Patient verbalized understanding of these instructions and education. Patient Consent Given: Yes Education handout provided: Yes Muscles treated: bil UT, bil C4 multifidi Electrical stimulation performed: No Parameters: N/A Treatment response/outcome: Twitch Response Elicited and Palpable Increase in Muscle Length   02/09/23  TherEx  UBE L2.5 x3 min forward/3 min backward Chin tucks x7 limited by radicular sx  Light upper trap stretches 2x30 seconds Light lateral flexion isometrics x5 Attempted  isometric cervical extension and flexion, pain limited   MHP in sitting x7 minutes, good skin condition noted after (not included in billing)  Manual Manual cervical traction Suboccipital release  02/02/23 EVAL Modalities: TENS to bil UT + MHP x 10 min  information provided on inexpensive TENS unit available via [employer]   PATIENT EDUCATION:  Education details: HEP Person educated: Patient Education method: Programmer, multimedia, Facilities manager, Verbal cues, and Handouts Education comprehension: verbalized understanding and returned demonstration  HOME EXERCISE PROGRAM: Access Code: [email or web address] URL: [email or web address] Date: 02/17/2023 Prepared by: Harrie Foreman  Exercises - Radial Nerve Tensioner  - 1 x daily - 7 x weekly - 1 sets - 5-10 reps - Seated Scapular Retraction  - 1 x daily - 7 x weekly - 1 sets - 5-10 reps - Seated Cervical Retraction  - 1 x daily - 7 x weekly - 1 sets - 5-10 reps - Cervical Extension AROM with Strap  - 1 x daily - 7 x weekly - 1 sets - 5-10 reps - Seated Assisted Cervical Rotation with Towel  - 1 x daily - 7 x weekly - 1 sets - 5-10 reps  ASSESSMENT:  CLINICAL IMPRESSION: Patient still reporting radicular symptoms and pain in neck, having difficulty getting appointment with pain management, MRI scheduled for next week.  Briefly improvement in pain following last session.  Today progressed HEP with gentle nerve glides, isometric  exercises and self snags, to perform in pain free ROM to tolerance.  Also continued manual therapy including TrDN with good tolerance and report of decreased pain and spasm following.  Anndy Grenier Treptow continues to demonstrate potential for improvement and would benefit from continued skilled therapy to address impairments.      OBJECTIVE IMPAIRMENTS: decreased activity tolerance, decreased endurance, decreased ROM, decreased strength, hypomobility, increased fascial restrictions, impaired perceived functional  ability, increased muscle spasms, impaired sensation, impaired UE functional use, and pain.   ACTIVITY LIMITATIONS: carrying, lifting, bending, sitting, squatting, sleeping, bed mobility, reach over head, and hygiene/grooming  PARTICIPATION LIMITATIONS: meal prep, cleaning, laundry, driving, shopping, community activity, occupation, and yard work  PERSONAL FACTORS: Time since onset of injury/illness/exacerbation and 3+ comorbidities: Bipolar, PTSD, chronic neck pain, chronic hep C.,history of R carpal tunnel release, R elbow arthroscopy  are also affecting patient's functional outcome.   REHAB POTENTIAL: Good  CLINICAL DECISION MAKING: Evolving/moderate complexity  EVALUATION COMPLEXITY: Moderate   GOALS: Goals reviewed with patient? Yes  SHORT TERM GOALS: Target date: 02/23/2023   Patient will be independent with initial HEP.  Baseline: needs Goal status: IN PROGRESS 02/17/23- given  LONG TERM GOALS: Target date: 03/16/2023   Patient will be independent with advanced/ongoing HEP to improve outcomes and carryover.  Baseline:  Goal status: IN PROGRESS  2.  Patient will report 75% improvement in neck pain to improve QOL.  Baseline:  Goal status: IN PROGRESS  3.  Patient will demonstrate full pain free cervical ROM for safety with driving.  Baseline: see objective Goal status: IN PROGRESS  4.  Patient will report at least 8 points improvement on NDI to demonstrate improved functional ability.  Baseline: 25/50 Goal status: IN PROGRESS  5.  Patient will demonstrate 75% improvement in radicular symptoms.  Baseline: numbness and tingling radiate down to both hands Goal status: IN PROGRESS  PLAN:  PT FREQUENCY: 1-2x/week  PT DURATION: 6 weeks  PLANNED INTERVENTIONS: 97164- PT Re-evaluation, 97110-Therapeutic exercises, 97530- Therapeutic activity, 97112- Neuromuscular re-education, 97535- Self Care, 09811- Manual therapy, 97014- Electrical stimulation (unattended), [id number]  Electrical stimulation (manual), [id number]- Ultrasound, 91478- Traction (mechanical), Patient/Family education, Balance training, Taping, Dry Needling, Joint mobilization, Joint manipulation, Spinal manipulation, Spinal mobilization, Cryotherapy, and Moist heat  PLAN FOR NEXT SESSION: HEP, focus on pain modulation to start, modalites, try cervical traction, healthy blue cannot bill traction, estim. Monitor cervical radiculopathy/adjust session PRN     Jena Gauss, PT, DPT 02/17/23 11:56 AM

## 2023-02-18 DIAGNOSIS — M542 Cervicalgia: Secondary | ICD-10-CM | POA: Diagnosis present

## 2023-02-18 DIAGNOSIS — M5412 Radiculopathy, cervical region: Secondary | ICD-10-CM | POA: Diagnosis present

## 2023-02-18 DIAGNOSIS — R252 Cramp and spasm: Secondary | ICD-10-CM | POA: Diagnosis present

## 2023-02-20 ENCOUNTER — Ambulatory Visit: Payer: BLUE CROSS/BLUE SHIELD

## 2023-02-20 DIAGNOSIS — M5412 Radiculopathy, cervical region: Secondary | ICD-10-CM | POA: Diagnosis not present

## 2023-02-20 DIAGNOSIS — R252 Cramp and spasm: Secondary | ICD-10-CM

## 2023-02-20 DIAGNOSIS — M542 Cervicalgia: Secondary | ICD-10-CM

## 2023-02-20 NOTE — Therapy (Signed)
OUTPATIENT PHYSICAL THERAPY CERVICAL TREATMENT   Patient Name: Scott Meza MRN: [id number] DOB:1971-01-03, 52 y.o., male Today's Date: 02/20/2023  END OF SESSION:  PT End of Session - 02/20/23 0821     Visit Number 5    Authorization Type BCBS    PT Start Time 0803    PT Stop Time 0856   12 min estim   PT Time Calculation (min) 53 min    Activity Tolerance Patient tolerated treatment well    Behavior During Therapy Scott Meza for tasks assessed/performed               Past Medical History:  Diagnosis Date   Anxiety    Cervical radiculopathy    Past Surgical History:  Procedure Laterality Date   CARPAL TUNNEL RELEASE Right    ELBOW ARTHROSCOPY Right    HEMORROIDECTOMY     Patient Active Problem List   Diagnosis Date Noted   Nocturia 01/17/2023   Chronic hepatitis C without hepatic coma (HCC) 01/17/2023   Cervical radiculopathy 04/06/2021   OA (osteoarthritis) of knee 08/07/2020   Bipolar 2 disorder, major depressive episode (HCC) 07/20/2020   GAD (generalized anxiety disorder) 07/20/2020   PTSD (post-traumatic stress disorder) 07/20/2020    PCP: Alfredia Ferguson, PA-C   REFERRING PROVIDER: Alfredia Ferguson, PA-C  REFERRING DIAG: [phone] (ICD-10-CM) - Cervical radiculopathy   THERAPY DIAG:  Radiculopathy, cervical region  Cramp and spasm  Cervicalgia  Rationale for Evaluation and Treatment: Rehabilitation  ONSET DATE: 01/13/23 seen at urgent care  SUBJECTIVE:                                                                                                                                                                                                         SUBJECTIVE STATEMENT: Pt reports his pain is not too bad today, the DN really helps.   EVAL: Hurting a lot, don't like to take the oxy too much, just if can't sleep, just trying to get the process going, before neurosurgeon tells me I have to go through PT, was going to do surgery before  through ortho and decided wanted to go through neurosurgeon then ended up moving to [place] and came back.  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 He reports neck pain and tension, with bilateral radiating numbness and pain down his arms. The pain has been worsening recently due to the nature of his work, which involves a lot of overhead work. The patient  has considered surgery in the past but has been hesitant due to the invasive nature of the proposed procedures. The patient reports that the pain is now worse than before. He historically saw [facility], reports history of MRI and injections through pain management. He is currently taking a muscle relaxant and gabapentin which he reports is minimally helpful. He is interested in surgical intervention, not more medication."  PMH: Bipolar, PTSD, chronic neck pain, chronic hep C., R carpal tunnel release, R elbow arthroscopy  PAIN:  Are you having pain? Yes: NPRS scale: 4/10 Pain location: varies, halfway down R shoulder  Pain description: pressure, clamping, squeezing  Aggravating factors: overhead movements, lifting weights, looking up  Relieving factors: heat, medication, ibuprofen  PRECAUTIONS: None  RED FLAGS: None     WEIGHT BEARING RESTRICTIONS: No  FALLS:  Has patient fallen in last 6 months? No  LIVING ENVIRONMENT: Lives with: lives with their family Lives in: House/apartment Stairs: No Has following equipment at home: None  OCCUPATION: Holiday representative  PLOF: Independent  PATIENT GOALS: feel better  NEXT MD VISIT: not yet scheduled   OBJECTIVE:   DIAGNOSTIC FINDINGS:  01/13/2023 Xray of cervical spine with multilevel degenerative changes, worst at C5-C6, C6-C7  MRI not available for review.   PATIENT SURVEYS:  NDI 25/50  COGNITION: Overall cognitive status: Within functional limits for tasks  assessed  SENSATION: Light touch: WFL Reports intermittant tingling in bil hands  POSTURE:  decreased cervical lordosis  PALPATION: Tenderness/tightness throughout cervical paraspinals, UT, levator scapulae mm.  Decreased mobility of cervical spine throughout.    CERVICAL ROM:   Active ROM A/PROM (deg) eval  Flexion 17  Extension 21  Right lateral flexion   Left lateral flexion   Right rotation 27  Left rotation 32   (Blank rows = not tested)  UPPER EXTREMITY ROM:  Active ROM Right eval Left eval  Shoulder flexion    Shoulder extension    Shoulder internal rotation    Shoulder external rotation    Elbow flexion    Elbow extension     (Blank rows = not tested)  UPPER EXTREMITY MMT:  MMT Right* eval Left* eval  Shoulder flexion 4+ 4+  Shoulder extension    Shoulder abduction 4+ 4+  Shoulder internal rotation 4+ 4+  Shoulder external rotation 4+ 4+  Elbow flexion 4+ 4+  Elbow extension 4+ 4+  Wrist flexion 4+ 4+  Wrist extension 4+ 4+  Grip strength 85 105   (Blank rows = not tested) *submax due to pain  CERVICAL SPECIAL TESTS:  Spurling's test: Positive, Distraction test: Positive, and Sharp pursor's test: Negative  TODAY'S TREATMENT:                                                                                                                              DATE:  02/20/23 Attempted UBE x - strain on L shoulder and neck UT stretch 2x30" B LS stretch x 30" -  increased strain on neck Chin tucks x 10 - isometric seated and supine Scap retraction  Cervical isometrics supine: side bend, rotation,   Manual Therapy: to decrease muscle spasm and pain and improve mobility STM with massage gun to L UT, LS Manual cervical traction 2 bouts Modalities: ESTIM to bil UT + MHP x 10 min intensity to tolerance 80-150Hz   02/17/23 Therapeutic Exercise: to improve strength and mobility.  Demo, verbal and tactile cues throughout for technique. Radial nerve glides -  tried 2 versions, to perform without head movements to decrease irritation when hand numb Gentle chin tuck - mostly isometric contraction Isometric scapular squeeze Self snag with pillow case - flexion and rotation to tolerance.  HEP update Manual Therapy: to decrease muscle spasm and pain and improve mobility In prone - STM/TPR to bil UT, L/S, cervical paraspinals, bil rhomboids UPA mobs cervical spine grade 1-2, PA mobs thoracic spine grade 1-2, skilled palpation and monitoring during dry needling. Trigger Point Dry-Needling  Treatment instructions: Expect mild to moderate muscle soreness. S/S of pneumothorax if dry needled over a lung field, and to seek immediate medical attention should they occur. Patient verbalized understanding of these instructions and education. Patient Consent Given: Yes Education handout provided: Previously provided Muscles treated: bil UT, bil C4, C5 multifidi Electrical stimulation performed: No Parameters: N/A Treatment response/outcome: Twitch Response Elicited and Palpable Increase in Muscle Length   02/13/23 Therapeutic Exercise: to improve strength and mobility.  Demo, verbal and tactile cues throughout for technique. UBE x 3 min forward - radicular symptoms in LUE Review HEP - move neck in non-painful directions, shrug shoulders if retro shoulder rolls hurt. Manual Therapy: to decrease muscle spasm and pain and improve mobility In prone - STM/TPR to bil UT, L/S, cervical paraspinals, bil rhomboids UPA mobs cervical spine grade 1-2, PA mobs thoracic spine grade 1-2, skilled palpation and monitoring during dry needling. Trigger Point Dry-Needling  Treatment instructions: Expect mild to moderate muscle soreness. S/S of pneumothorax if dry needled over a lung field, and to seek immediate medical attention should they occur. Patient verbalized understanding of these instructions and education. Patient Consent Given: Yes Education handout provided: Yes Muscles  treated: bil UT, bil C4 multifidi Electrical stimulation performed: No Parameters: N/A Treatment response/outcome: Twitch Response Elicited and Palpable Increase in Muscle Length   02/09/23  TherEx  UBE L2.5 x3 min forward/3 min backward Chin tucks x7 limited by radicular sx  Light upper trap stretches 2x30 seconds Light lateral flexion isometrics x5 Attempted isometric cervical extension and flexion, pain limited   MHP in sitting x7 minutes, good skin condition noted after (not included in billing)  Manual Manual cervical traction Suboccipital release  02/02/23 EVAL Modalities: TENS to bil UT + MHP x 10 min  information provided on inexpensive TENS unit available via [employer]   PATIENT EDUCATION:  Education details: HEP Person educated: Patient Education method: Programmer, multimedia, Facilities manager, Verbal cues, and Handouts Education comprehension: verbalized understanding and returned demonstration  HOME EXERCISE PROGRAM: Access Code: [email or web address] URL: [email or web address] Date: 02/17/2023 Prepared by: Harrie Foreman  Exercises - Radial Nerve Tensioner  - 1 x daily - 7 x weekly - 1 sets - 5-10 reps - Seated Scapular Retraction  - 1 x daily - 7 x weekly - 1 sets - 5-10 reps - Seated Cervical Retraction  - 1 x daily - 7 x weekly - 1 sets - 5-10 reps - Cervical Extension AROM with Strap  - 1 x daily - 7 x weekly - 1 sets -  5-10 reps - Seated Assisted Cervical Rotation with Towel  - 1 x daily - 7 x weekly - 1 sets - 5-10 reps  ASSESSMENT:  CLINICAL IMPRESSION: Pt reports improved pain levels, despite having pain with most exercises today. He continues to be limited with his cervical spine ROM w/o pain. Attempted manual cervical traction but pt denied any relief, afterwards. He did respond well to the MT and estim, continuing to show increased muscle tension in the UT/LS musculature.  Scott Meza continues to demonstrate potential for improvement and would  benefit from continued skilled therapy to address impairments.      OBJECTIVE IMPAIRMENTS: decreased activity tolerance, decreased endurance, decreased ROM, decreased strength, hypomobility, increased fascial restrictions, impaired perceived functional ability, increased muscle spasms, impaired sensation, impaired UE functional use, and pain.   ACTIVITY LIMITATIONS: carrying, lifting, bending, sitting, squatting, sleeping, bed mobility, reach over head, and hygiene/grooming  PARTICIPATION LIMITATIONS: meal prep, cleaning, laundry, driving, shopping, community activity, occupation, and yard work  PERSONAL FACTORS: Time since onset of injury/illness/exacerbation and 3+ comorbidities: Bipolar, PTSD, chronic neck pain, chronic hep C.,history of R carpal tunnel release, R elbow arthroscopy  are also affecting patient's functional outcome.   REHAB POTENTIAL: Good  CLINICAL DECISION MAKING: Evolving/moderate complexity  EVALUATION COMPLEXITY: Moderate   GOALS: Goals reviewed with patient? Yes  SHORT TERM GOALS: Target date: 02/23/2023   Patient will be independent with initial HEP.  Baseline: needs Goal status: IN PROGRESS 02/17/23- given  LONG TERM GOALS: Target date: 03/16/2023   Patient will be independent with advanced/ongoing HEP to improve outcomes and carryover.  Baseline:  Goal status: IN PROGRESS  2.  Patient will report 75% improvement in neck pain to improve QOL.  Baseline:  Goal status: IN PROGRESS  3.  Patient will demonstrate full pain free cervical ROM for safety with driving.  Baseline: see objective Goal status: IN PROGRESS  4.  Patient will report at least 8 points improvement on NDI to demonstrate improved functional ability.  Baseline: 25/50 Goal status: IN PROGRESS  5.  Patient will demonstrate 75% improvement in radicular symptoms.  Baseline: numbness and tingling radiate down to both hands Goal status: IN PROGRESS  PLAN:  PT FREQUENCY:  1-2x/week  PT DURATION: 6 weeks  PLANNED INTERVENTIONS: 97164- PT Re-evaluation, 97110-Therapeutic exercises, 97530- Therapeutic activity, 97112- Neuromuscular re-education, 97535- Self Care, 13086- Manual therapy, 97014- Electrical stimulation (unattended), [id number]- Electrical stimulation (manual), [id number]- Ultrasound, 57846- Traction (mechanical), Patient/Family education, Balance training, Taping, Dry Needling, Joint mobilization, Joint manipulation, Spinal manipulation, Spinal mobilization, Cryotherapy, and Moist heat  PLAN FOR NEXT SESSION: HEP, focus on pain modulation to start, modalites, try cervical traction, healthy blue cannot bill traction, estim. Monitor cervical radiculopathy/adjust session PRN     Darleene Cleaver, PTA 02/20/23 8:56 AM

## 2023-02-23 ENCOUNTER — Ambulatory Visit: Payer: BLUE CROSS/BLUE SHIELD | Admitting: Physical Therapy

## 2023-02-28 ENCOUNTER — Encounter: Payer: Self-pay | Admitting: Physical Therapy

## 2023-02-28 ENCOUNTER — Ambulatory Visit: Payer: BLUE CROSS/BLUE SHIELD | Admitting: Physical Therapy

## 2023-02-28 DIAGNOSIS — M542 Cervicalgia: Secondary | ICD-10-CM

## 2023-02-28 DIAGNOSIS — R252 Cramp and spasm: Secondary | ICD-10-CM

## 2023-02-28 DIAGNOSIS — M5412 Radiculopathy, cervical region: Secondary | ICD-10-CM

## 2023-02-28 NOTE — Therapy (Signed)
 OUTPATIENT PHYSICAL THERAPY CERVICAL TREATMENT   Patient Name: JABIER DEESE MRN: [id number] DOB:1970-04-13, 52 y.o., male Today's Date: 02/28/2023  END OF SESSION:  PT End of Session - 02/28/23 0909     Visit Number 6    Authorization Type BCBS    PT Start Time 0850    PT Stop Time 0931    PT Time Calculation (min) 41 min    Activity Tolerance Patient tolerated treatment well    Behavior During Therapy [facility] for tasks assessed/performed               Past Medical History:  Diagnosis Date   Anxiety    Cervical radiculopathy    Past Surgical History:  Procedure Laterality Date   CARPAL TUNNEL RELEASE Right    ELBOW ARTHROSCOPY Right    HEMORROIDECTOMY     Patient Active Problem List   Diagnosis Date Noted   Nocturia 01/17/2023   Chronic hepatitis C without hepatic coma (HCC) 01/17/2023   Cervical radiculopathy 04/06/2021   OA (osteoarthritis) of knee 08/07/2020   Bipolar 2 disorder, major depressive episode (HCC) 07/20/2020   GAD (generalized anxiety disorder) 07/20/2020   PTSD (post-traumatic stress disorder) 07/20/2020    PCP: Cyndi Shaver, PA-C   REFERRING PROVIDER: Cyndi Shaver, PA-C  REFERRING DIAG: [phone] (ICD-10-CM) - Cervical radiculopathy   THERAPY DIAG:  Radiculopathy, cervical region  Cramp and spasm  Cervicalgia  Rationale for Evaluation and Treatment: Rehabilitation  ONSET DATE: 01/13/23 seen at urgent care  SUBJECTIVE:                                                                                                                                                                                                         SUBJECTIVE STATEMENT: Pt reports his pain is not too bad today, still really hurts at night when trying to sleep and getting the numbness/tingling down his arms R>L.  Got MRI results back, has appt. With neurosurgeon on 03/13/23.    EVAL: Hurting a lot, don't like to take the oxy too much, just if can't  sleep, just trying to get the process going, before neurosurgeon tells me I have to go through PT, was going to do surgery before through ortho and decided wanted to go through neurosurgeon then ended up moving to [place]  and came back.  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 He reports neck pain and tension, with bilateral radiating  numbness and pain down his arms. The pain has been worsening recently due to the nature of his work, which involves a lot of overhead work. The patient has considered surgery in the past but has been hesitant due to the invasive nature of the proposed procedures. The patient reports that the pain is now worse than before. He historically saw [facility], reports history of MRI and injections through pain management. He is currently taking a muscle relaxant and gabapentin  which he reports is minimally helpful. He is interested in surgical intervention, not more medication.  PMH: Bipolar, PTSD, chronic neck pain, chronic hep C., R carpal tunnel release, R elbow arthroscopy  PAIN:  Are you having pain? Yes: NPRS scale: 3-4/10 Pain location: varies, halfway down R shoulder  Pain description: pressure, clamping, squeezing  Aggravating factors: overhead movements, lifting weights, looking up  Relieving factors: heat, medication, ibuprofen  PRECAUTIONS: None  RED FLAGS: None     WEIGHT BEARING RESTRICTIONS: No  FALLS:  Has patient fallen in last 6 months? No  LIVING ENVIRONMENT: Lives with: lives with their family Lives in: House/apartment Stairs: No Has following equipment at home: None  OCCUPATION: holiday representative  PLOF: Independent  PATIENT GOALS: feel better   OBJECTIVE:   DIAGNOSTIC FINDINGS:  01/13/2023 Xray of cervical spine with multilevel degenerative changes, worst at C5-C6, C6-C7   MRI not available for review.   PATIENT SURVEYS:  NDI 25/50  COGNITION: Overall cognitive status: Within functional limits for tasks assessed  SENSATION: Light touch: WFL Reports intermittant tingling in bil hands  POSTURE:  decreased cervical lordosis  PALPATION: Tenderness/tightness throughout cervical paraspinals, UT, levator scapulae mm.  Decreased mobility of cervical spine throughout.    CERVICAL ROM:   Active ROM A/PROM (deg) eval  Flexion 17  Extension 21  Right lateral flexion   Left lateral flexion   Right rotation 27  Left rotation 32   (Blank rows = not tested)  UPPER EXTREMITY ROM:  Active ROM Right eval Left eval  Shoulder flexion    Shoulder extension    Shoulder internal rotation    Shoulder external rotation    Elbow flexion    Elbow extension     (Blank rows = not tested)  UPPER EXTREMITY MMT:  MMT Right* eval Left* eval  Shoulder flexion 4+ 4+  Shoulder extension    Shoulder abduction 4+ 4+  Shoulder internal rotation 4+ 4+  Shoulder external rotation 4+ 4+  Elbow flexion 4+ 4+  Elbow extension 4+ 4+  Wrist flexion 4+ 4+  Wrist extension 4+ 4+  Grip strength 85 105   (Blank rows = not tested) *submax due to pain  CERVICAL SPECIAL TESTS:  Spurling's test: Positive, Distraction test: Positive, and Sharp pursor's test: Negative  TODAY'S TREATMENT:                                                                                                                              DATE:  02/28/23 Self Care: Reviewed MRI results with patient and explained with anatomical model, discussed possible next steps Mechanical  Cervical Traction x only 5 min total treatment time completed after several readjustments at 30 lbs max  60 sec on/10 sec rest, 2 steps up, 2 steps down, with table at 20 deg, to decrease radicular symptoms and pain.   Patient did not find head position comfortable and requested to stop.  Manual Therapy: to decrease muscle  spasm and pain and improve mobility STM/TPR to R SCM/anterior scalenes, 1st rib mobilization, cervical paraspinals, suboccipital release, manual cervical traction Modalities: MHP to neck in supine x 10 min (post session, not included in treatment time)  02/20/23 Attempted UBE x - strain on L shoulder and neck UT stretch 2x30 B LS stretch x 30 - increased strain on neck Chin tucks x 10 - isometric seated and supine Scap retraction  Cervical isometrics supine: side bend, rotation,   Manual Therapy: to decrease muscle spasm and pain and improve mobility STM with massage gun to L UT, LS Manual cervical traction 2 bouts Modalities: ESTIM to bil UT + MHP x 10 min intensity to tolerance 80-150Hz   02/17/23 Therapeutic Exercise: to improve strength and mobility.  Demo, verbal and tactile cues throughout for technique. Radial nerve glides - tried 2 versions, to perform without head movements to decrease irritation when hand numb Gentle chin tuck - mostly isometric contraction Isometric scapular squeeze Self snag with pillow case - flexion and rotation to tolerance.  HEP update Manual Therapy: to decrease muscle spasm and pain and improve mobility In prone - STM/TPR to bil UT, L/S, cervical paraspinals, bil rhomboids UPA mobs cervical spine grade 1-2, PA mobs thoracic spine grade 1-2, skilled palpation and monitoring during dry needling. Trigger Point Dry-Needling  Treatment instructions: Expect mild to moderate muscle soreness. S/S of pneumothorax if dry needled over a lung field, and to seek immediate medical attention should they occur. Patient verbalized understanding of these instructions and education. Patient Consent Given: Yes Education handout provided: Previously provided Muscles treated: bil UT, bil C4, C5 multifidi Electrical stimulation performed: No Parameters: N/A Treatment response/outcome: Twitch Response Elicited and Palpable Increase in Muscle Length     PATIENT  EDUCATION:  Education details: anatomy & imaging Person educated: Patient Education method: Explanation and Demonstration Education comprehension: verbalized understanding  HOME EXERCISE PROGRAM: Access Code: [email or web address] URL: [email or web address] Date: 02/17/2023 Prepared by: Almarie Sprinkles  Exercises - Radial Nerve Tensioner  - 1 x daily - 7 x weekly - 1 sets - 5-10 reps - Seated Scapular Retraction  - 1 x daily - 7 x weekly - 1 sets - 5-10 reps - Seated Cervical Retraction  - 1 x daily - 7 x weekly - 1 sets - 5-10 reps - Cervical Extension AROM with Strap  - 1 x daily - 7 x weekly - 1 sets - 5-10 reps - Seated Assisted Cervical Rotation with Towel  - 1 x daily - 7 x weekly - 1 sets - 5-10 reps  ASSESSMENT:  CLINICAL IMPRESSION: Patient reports continued pain primarily worse at night, poor activity tolerance and numbness and tingling down both arms R>L.  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  Trialed traction today, while he reports good relief with manual cervical traction could not find comfortable setting with mechanical cervical traction.  Sahaj Bona Mahrt continues to demonstrate potential for improvement and would benefit from continued skilled therapy to address  impairments.      OBJECTIVE IMPAIRMENTS: decreased activity tolerance, decreased endurance, decreased ROM, decreased strength, hypomobility, increased fascial restrictions, impaired perceived functional ability, increased muscle spasms, impaired sensation, impaired UE functional use, and pain.   ACTIVITY LIMITATIONS: carrying, lifting, bending, sitting, squatting, sleeping, bed mobility, reach over head, and hygiene/grooming  PARTICIPATION LIMITATIONS: meal prep, cleaning, laundry, driving, shopping, community activity, occupation, and yard work  PERSONAL FACTORS: Time since onset of  injury/illness/exacerbation and 3+ comorbidities: Bipolar, PTSD, chronic neck pain, chronic hep C.,history of R carpal tunnel release, R elbow arthroscopy  are also affecting patient's functional outcome.   REHAB POTENTIAL: Good  CLINICAL DECISION MAKING: Evolving/moderate complexity  EVALUATION COMPLEXITY: Moderate   GOALS: Goals reviewed with patient? Yes  SHORT TERM GOALS: Target date: 02/23/2023   Patient will be independent with initial HEP.  Baseline: needs Goal status: IN PROGRESS 02/17/23- given  LONG TERM GOALS: Target date: 03/16/2023   Patient will be independent with advanced/ongoing HEP to improve outcomes and carryover.  Baseline:  Goal status: IN PROGRESS  2.  Patient will report 75% improvement in neck pain to improve QOL.  Baseline:  Goal status: IN PROGRESS  3.  Patient will demonstrate full pain free cervical ROM for safety with driving.  Baseline: see objective Goal status: IN PROGRESS  4.  Patient will report at least 8 points improvement on NDI to demonstrate improved functional ability.  Baseline: 25/50 Goal status: IN PROGRESS  5.  Patient will demonstrate 75% improvement in radicular symptoms.  Baseline: numbness and tingling radiate down to both hands Goal status: IN PROGRESS  PLAN:  PT FREQUENCY: 1-2x/week  PT DURATION: 6 weeks  PLANNED INTERVENTIONS: 97164- PT Re-evaluation, 97110-Therapeutic exercises, 97530- Therapeutic activity, 97112- Neuromuscular re-education, 97535- Self Care, 02859- Manual therapy, 97014- Electrical stimulation (unattended), [id number]- Electrical stimulation (manual), [id number]- Ultrasound, 02987- Traction (mechanical), Patient/Family education, Balance training, Taping, Dry Needling, Joint mobilization, Joint manipulation, Spinal manipulation, Spinal mobilization, Cryotherapy, and Moist heat  PLAN FOR NEXT SESSION: HEP, focus on pain modulation to start, modalites,  healthy blue cannot bill traction- didn't like, not  comfortable, estim. Monitor cervical radiculopathy/adjust session PRN     Almarie JINNY Sprinkles, PT 02/28/23 10:03 AM

## 2023-03-03 ENCOUNTER — Ambulatory Visit: Payer: Medicaid Other | Attending: Physician Assistant

## 2023-03-03 DIAGNOSIS — R252 Cramp and spasm: Secondary | ICD-10-CM | POA: Diagnosis present

## 2023-03-03 DIAGNOSIS — M5412 Radiculopathy, cervical region: Secondary | ICD-10-CM | POA: Insufficient documentation

## 2023-03-03 DIAGNOSIS — M542 Cervicalgia: Secondary | ICD-10-CM | POA: Insufficient documentation

## 2023-03-03 NOTE — Therapy (Signed)
 OUTPATIENT PHYSICAL THERAPY CERVICAL TREATMENT   Patient Name: Scott Meza MRN: [id number] DOB:Sep 13, 1970, 53 y.o., male Today's Date: 03/03/2023  END OF SESSION:      Past Medical History:  Diagnosis Date   Anxiety    Cervical radiculopathy    Past Surgical History:  Procedure Laterality Date   CARPAL TUNNEL RELEASE Right    ELBOW ARTHROSCOPY Right    HEMORROIDECTOMY     Patient Active Problem List   Diagnosis Date Noted   Nocturia 01/17/2023   Chronic hepatitis C without hepatic coma (HCC) 01/17/2023   Cervical radiculopathy 04/06/2021   OA (osteoarthritis) of knee 08/07/2020   Bipolar 2 disorder, major depressive episode (HCC) 07/20/2020   GAD (generalized anxiety disorder) 07/20/2020   PTSD (post-traumatic stress disorder) 07/20/2020    PCP: Cyndi Shaver, PA-C   REFERRING PROVIDER: Cyndi Shaver, PA-C  REFERRING DIAG: [phone] (ICD-10-CM) - Cervical radiculopathy   THERAPY DIAG:  Radiculopathy, cervical region  Cramp and spasm  Cervicalgia  Rationale for Evaluation and Treatment: Rehabilitation  ONSET DATE: 01/13/23 seen at urgent care  SUBJECTIVE:                                                                                                                                                                                                         SUBJECTIVE STATEMENT: Pt reports continued pain today, he is going to talk to surgeon about injections for spine.    EVAL: Hurting a lot, don't like to take the oxy too much, just if can't sleep, just trying to get the process going, before neurosurgeon tells me I have to go through PT, was going to do surgery before through ortho and decided wanted to go through neurosurgeon then ended up moving to [place]  and came back.  Hand dominance: Right  NEXT MD VISIT: 03/13/23 with neurosurgeon  PERTINENT HISTORY:  From MD note The patient, a corporate investment banker, presents with a long-standing issue  of radiating neck pain. He reports neck pain and tension, with bilateral radiating numbness and pain down his arms. The pain has been worsening recently due to the nature of his work, which involves a lot of overhead work. The patient has considered surgery in the past but has been hesitant due to the invasive nature of the proposed procedures. The patient reports that the pain is now worse than before. He historically saw [facility], reports history of MRI and injections through pain management. He is currently taking a muscle relaxant and gabapentin  which he reports is minimally helpful. He is interested in surgical intervention,  not more medication.  PMH: Bipolar, PTSD, chronic neck pain, chronic hep C., R carpal tunnel release, R elbow arthroscopy  PAIN:  Are you having pain? Yes: NPRS scale: 3-4/10 Pain location: varies, halfway down R shoulder  Pain description: pressure, clamping, squeezing  Aggravating factors: overhead movements, lifting weights, looking up  Relieving factors: heat, medication, ibuprofen  PRECAUTIONS: None  RED FLAGS: None     WEIGHT BEARING RESTRICTIONS: No  FALLS:  Has patient fallen in last 6 months? No  LIVING ENVIRONMENT: Lives with: lives with their family Lives in: House/apartment Stairs: No Has following equipment at home: None  OCCUPATION: holiday representative  PLOF: Independent  PATIENT GOALS: feel better   OBJECTIVE:   DIAGNOSTIC FINDINGS:  01/13/2023 Xray of cervical spine with multilevel degenerative changes, worst at C5-C6, C6-C7  MRI not available for review.   PATIENT SURVEYS:  NDI 25/50  COGNITION: Overall cognitive status: Within functional limits for tasks assessed  SENSATION: Light touch: WFL Reports intermittant tingling in bil hands  POSTURE:  decreased cervical lordosis  PALPATION: Tenderness/tightness throughout cervical paraspinals, UT, levator scapulae mm.  Decreased mobility of cervical spine throughout.     CERVICAL ROM:   Active ROM A/PROM (deg) eval 03/03/23  Flexion 17 20  Extension 21 20  Right lateral flexion    Left lateral flexion    Right rotation 27 26  Left rotation 32 33   (Blank rows = not tested)  UPPER EXTREMITY ROM:  Active ROM Right eval Left eval  Shoulder flexion    Shoulder extension    Shoulder internal rotation    Shoulder external rotation    Elbow flexion    Elbow extension     (Blank rows = not tested)  UPPER EXTREMITY MMT:  MMT Right* eval Left* eval  Shoulder flexion 4+ 4+  Shoulder extension    Shoulder abduction 4+ 4+  Shoulder internal rotation 4+ 4+  Shoulder external rotation 4+ 4+  Elbow flexion 4+ 4+  Elbow extension 4+ 4+  Wrist flexion 4+ 4+  Wrist extension 4+ 4+  Grip strength 85 105   (Blank rows = not tested) *submax due to pain  CERVICAL SPECIAL TESTS:  Spurling's test: Positive, Distraction test: Positive, and Sharp pursor's test: Negative  TODAY'S TREATMENT:                                                                                                                              DATE:  03/03/23 UBE for 5 min alternating forward and backward Seated lat pulls 15# x 10  Standing shld ext 10# x 10  Seated low rows x 15# Standing shoulder rolls 5lb weights x 10  Manual Therapy: to decrease muscle spasm and pain and improve mobility STM to bil UT, LS, scalenes, suboccipitals  Modalities: TENS to bil UT and mid traps + MHP x 10 min intensity to tolerance   NDI: 23 / 50 = 46.0 %  02/28/23 Self Care: Reviewed  MRI results with patient and explained with anatomical model, discussed possible next steps Mechanical  Cervical Traction x only 5 min total treatment time completed after several readjustments at 30 lbs max  60 sec on/10 sec rest, 2 steps up, 2 steps down, with table at 20 deg, to decrease radicular symptoms and pain.   Patient did not find head position comfortable and requested to stop.  Manual Therapy: to decrease  muscle spasm and pain and improve mobility STM/TPR to R SCM/anterior scalenes, 1st rib mobilization, cervical paraspinals, suboccipital release, manual cervical traction Modalities: MHP to neck in supine x 10 min (post session, not included in treatment time)  02/20/23 Attempted UBE x - strain on L shoulder and neck UT stretch 2x30 B LS stretch x 30 - increased strain on neck Chin tucks x 10 - isometric seated and supine Scap retraction  Cervical isometrics supine: side bend, rotation,   Manual Therapy: to decrease muscle spasm and pain and improve mobility STM with massage gun to L UT, LS Manual cervical traction 2 bouts Modalities: ESTIM to bil UT + MHP x 10 min intensity to tolerance 80-150Hz   02/17/23 Therapeutic Exercise: to improve strength and mobility.  Demo, verbal and tactile cues throughout for technique. Radial nerve glides - tried 2 versions, to perform without head movements to decrease irritation when hand numb Gentle chin tuck - mostly isometric contraction Isometric scapular squeeze Self snag with pillow case - flexion and rotation to tolerance.  HEP update Manual Therapy: to decrease muscle spasm and pain and improve mobility In prone - STM/TPR to bil UT, L/S, cervical paraspinals, bil rhomboids UPA mobs cervical spine grade 1-2, PA mobs thoracic spine grade 1-2, skilled palpation and monitoring during dry needling. Trigger Point Dry-Needling  Treatment instructions: Expect mild to moderate muscle soreness. S/S of pneumothorax if dry needled over a lung field, and to seek immediate medical attention should they occur. Patient verbalized understanding of these instructions and education. Patient Consent Given: Yes Education handout provided: Previously provided Muscles treated: bil UT, bil C4, C5 multifidi Electrical stimulation performed: No Parameters: N/A Treatment response/outcome: Twitch Response Elicited and Palpable Increase in Muscle Length      PATIENT EDUCATION:  Education details: anatomy & imaging Person educated: Patient Education method: Explanation and Demonstration Education comprehension: verbalized understanding  HOME EXERCISE PROGRAM: Access Code: [email or web address] URL: [email or web address] Date: 02/17/2023 Prepared by: Almarie Sprinkles  Exercises - Radial Nerve Tensioner  - 1 x daily - 7 x weekly - 1 sets - 5-10 reps - Seated Scapular Retraction  - 1 x daily - 7 x weekly - 1 sets - 5-10 reps - Seated Cervical Retraction  - 1 x daily - 7 x weekly - 1 sets - 5-10 reps - Cervical Extension AROM with Strap  - 1 x daily - 7 x weekly - 1 sets - 5-10 reps - Seated Assisted Cervical Rotation with Towel  - 1 x daily - 7 x weekly - 1 sets - 5-10 reps  ASSESSMENT:  CLINICAL IMPRESSION: Pt continues to have mild neck pain upon arrival. He did ok with the machine interventions, towards the end he started to report increased strain on his L shoulder. He is very tight in his cervical musculature (specifics under treatment) both sides. NDI score is slightly less than the first time completed. His Cervical AROM shows slight improvement compared to IE. He reports having f/u with neurosurgeon on 1/13 to discuss other treatment options. Chancellor Vanderloop Pinnix continues to demonstrate potential for improvement  and would benefit from continued skilled therapy to address impairments.      OBJECTIVE IMPAIRMENTS: decreased activity tolerance, decreased endurance, decreased ROM, decreased strength, hypomobility, increased fascial restrictions, impaired perceived functional ability, increased muscle spasms, impaired sensation, impaired UE functional use, and pain.   ACTIVITY LIMITATIONS: carrying, lifting, bending, sitting, squatting, sleeping, bed mobility, reach over head, and hygiene/grooming  PARTICIPATION LIMITATIONS: meal prep, cleaning, laundry, driving, shopping, community activity, occupation, and yard  work  PERSONAL FACTORS: Time since onset of injury/illness/exacerbation and 3+ comorbidities: Bipolar, PTSD, chronic neck pain, chronic hep C.,history of R carpal tunnel release, R elbow arthroscopy  are also affecting patient's functional outcome.   REHAB POTENTIAL: Good  CLINICAL DECISION MAKING: Evolving/moderate complexity  EVALUATION COMPLEXITY: Moderate   GOALS: Goals reviewed with patient? Yes  SHORT TERM GOALS: Target date: 02/23/2023   Patient will be independent with initial HEP.  Baseline: needs Goal status: IN PROGRESS 02/17/23- given  LONG TERM GOALS: Target date: 03/16/2023   Patient will be independent with advanced/ongoing HEP to improve outcomes and carryover.  Baseline:  Goal status: IN PROGRESS  2.  Patient will report 75% improvement in neck pain to improve QOL.  Baseline:  Goal status: IN PROGRESS  3.  Patient will demonstrate full pain free cervical ROM for safety with driving.  Baseline: see objective Goal status: IN PROGRESS- 03/03/23 assessed, see ROM chart  4.  Patient will report at least 8 points improvement on NDI to demonstrate improved functional ability.  Baseline: 25/50 Goal status: IN PROGRESS- 23 / 50 = 46.0 % 03/03/23  5.  Patient will demonstrate 75% improvement in radicular symptoms.  Baseline: numbness and tingling radiate down to both hands Goal status: IN PROGRESS  PLAN:  PT FREQUENCY: 1-2x/week  PT DURATION: 6 weeks  PLANNED INTERVENTIONS: 97164- PT Re-evaluation, 97110-Therapeutic exercises, 97530- Therapeutic activity, 97112- Neuromuscular re-education, 97535- Self Care, 02859- Manual therapy, 97014- Electrical stimulation (unattended), [id number]- Electrical stimulation (manual), [id number]- Ultrasound, 02987- Traction (mechanical), Patient/Family education, Balance training, Taping, Dry Needling, Joint mobilization, Joint manipulation, Spinal manipulation, Spinal mobilization, Cryotherapy, and Moist heat  PLAN FOR NEXT SESSION: HEP,  focus on pain modulation to start, modalites,  healthy blue cannot bill traction- didn't like, not comfortable, estim. Monitor cervical radiculopathy/adjust session PRN     Sol LITTIE Gaskins, PTA 03/03/23 8:47 AM

## 2023-03-07 ENCOUNTER — Ambulatory Visit: Payer: Medicaid Other | Admitting: Physical Therapy

## 2023-03-07 ENCOUNTER — Encounter: Payer: Self-pay | Admitting: Physical Medicine & Rehabilitation

## 2023-03-07 NOTE — Progress Notes (Signed)
      Established patient visit   Patient: Scott Meza   DOB: Jun 28, 1970   53 y.o. Male  MRN: [id number] Visit Date: 03/08/2023  Today's healthcare provider: Manuelita Flatness, PA-C   Cc. Sinus congestion, pressure, cough  Subjective     Pt reports fatigue, nasal congestion, PND, cough, rhinorrhea, for the last week. Denies fevers. Taking ibuprofen and nyquil over the counter.  Medications: Outpatient Medications Prior to Visit  Medication Sig   gabapentin  (NEURONTIN ) 300 MG capsule Take 1 capsule (300 mg total) by mouth at bedtime.   methocarbamol  (ROBAXIN ) 500 MG tablet Take 1 tablet (500 mg total) by mouth 2 (two) times daily.   [DISCONTINUED] lidocaine  (LIDODERM ) 5 % Place 1 patch onto the skin daily. Remove & Discard patch within 12 hours or as directed by MD   [DISCONTINUED] oxyCODONE  (OXY IR/ROXICODONE ) 5 MG immediate release tablet Take 1 tablet (5 mg total) by mouth every 6 (six) hours as needed for severe pain (pain score 7-10).   No facility-administered medications prior to visit.    Review of Systems  Constitutional:  Positive for fatigue. Negative for fever.  HENT:  Positive for congestion, postnasal drip and rhinorrhea.   Respiratory:  Positive for cough. Negative for shortness of breath.   Cardiovascular:  Negative for chest pain, palpitations and leg swelling.  Neurological:  Negative for dizziness and headaches.       Objective    BP 128/88   Pulse 93   Temp 98 F (36.7 C) (Oral)   Ht 6' (1.829 m)   Wt 243 lb 6 oz (110.4 kg)   SpO2 97%   BMI 33.01 kg/m    Physical Exam Constitutional:      General: He is awake.     Appearance: He is well-developed.  HENT:     Head: Normocephalic.     Right Ear: Tympanic membrane normal.     Left Ear: Tympanic membrane normal.     Nose: Congestion and rhinorrhea present.  Eyes:     Conjunctiva/sclera: Conjunctivae normal.  Cardiovascular:     Rate and Rhythm: Normal rate.  Pulmonary:     Effort:  Pulmonary effort is normal.     Breath sounds: Normal breath sounds.  Skin:    General: Skin is warm.  Neurological:     Mental Status: He is alert and oriented to person, place, and time.  Psychiatric:        Attention and Perception: Attention normal.        Mood and Affect: Mood normal.        Speech: Speech normal.        Behavior: Behavior is cooperative.      No results found for any visits on 03/08/23.  Assessment & Plan    Acute non-recurrent maxillary sinusitis -     Amoxicillin ; Take 1 tablet (875 mg total) by mouth 2 (two) times daily for 7 days.  Dispense: 14 tablet; Refill: 0    Rest, hydration, antihistamines otc, cont ibuprofen, nyquil Rx amoxicillin  bid x 7 days  Return if symptoms worsen or fail to improve.       Manuelita Flatness, PA-C  [facility] [phone] (phone) [phone] (fax)  [facility]

## 2023-03-08 ENCOUNTER — Telehealth: Payer: Self-pay

## 2023-03-08 ENCOUNTER — Ambulatory Visit: Payer: Medicaid Other | Admitting: Physician Assistant

## 2023-03-08 ENCOUNTER — Encounter: Payer: Self-pay | Admitting: Physician Assistant

## 2023-03-08 VITALS — BP 128/88 | HR 93 | Temp 98.0°F | Ht 72.0 in | Wt 243.4 lb

## 2023-03-08 DIAGNOSIS — J0100 Acute maxillary sinusitis, unspecified: Secondary | ICD-10-CM

## 2023-03-08 MED ORDER — AMOXICILLIN 875 MG PO TABS: 875.0000 mg | ORAL_TABLET | Freq: Two times a day (BID) | ORAL | 0 refills | Status: AC

## 2023-03-08 NOTE — Telephone Encounter (Signed)
 Spoke with pts pharmacy, Rx has been transferred to requested pharmacy.

## 2023-03-08 NOTE — Telephone Encounter (Signed)
 Copied from CRM [id number]. Topic: Clinical - Prescription Issue >> Mar 08, 2023 11:23 AM Eleanor C wrote: Reason for CRM: patient was there today and got amoxicillin  (AMOXIL ) 875 MG tablet called into his pharmacy. However, that pharmacy didn't have his insurance straight and he is trying to get it somewhere else. Patient is now going to [facility] [address] [place], [place] [address]. Phone number- [phone]. He is headed there now to make sure they have his correct insurance on file. Patient then asks that clinical team give him a call to let him know that prescription was transferred. Thank you.

## 2023-03-10 ENCOUNTER — Ambulatory Visit: Payer: Medicaid Other

## 2023-03-10 DIAGNOSIS — M542 Cervicalgia: Secondary | ICD-10-CM

## 2023-03-10 DIAGNOSIS — R252 Cramp and spasm: Secondary | ICD-10-CM

## 2023-03-10 DIAGNOSIS — M5412 Radiculopathy, cervical region: Secondary | ICD-10-CM

## 2023-03-10 NOTE — Therapy (Addendum)
 OUTPATIENT PHYSICAL THERAPY CERVICAL TREATMENT/ Discharge Summary   Patient Name: Scott Meza MRN: [id number] DOB:08-15-1970, 53 y.o., male Today's Date: 03/10/2023  END OF SESSION:  PT End of Session - 03/10/23 0930     Visit Number 8    Authorization Type BC[employer]    PT Start Time 0846    PT Stop Time 0934    PT Time Calculation (min) 48 min    Activity Tolerance Patient tolerated treatment well    Behavior During Therapy [facility] for tasks assessed/performed                Past Medical History:  Diagnosis Date   Anxiety    Cervical radiculopathy    Past Surgical History:  Procedure Laterality Date   CARPAL TUNNEL RELEASE Right    ELBOW ARTHROSCOPY Right    HEMORROIDECTOMY     Patient Active Problem List   Diagnosis Date Noted   Nocturia 01/17/2023   Chronic hepatitis C without hepatic coma (HCC) 01/17/2023   Cervical radiculopathy 04/06/2021   OA (osteoarthritis) of knee 08/07/2020   Bipolar 2 disorder, major depressive episode (HCC) 07/20/2020   GAD (generalized anxiety disorder) 07/20/2020   PTSD (post-traumatic stress disorder) 07/20/2020    PCP: Cyndi Shaver, PA-C   REFERRING PROVIDER: Cyndi Shaver, PA-C  REFERRING DIAG: [phone] (ICD-10-CM) - Cervical radiculopathy   THERAPY DIAG:  Radiculopathy, cervical region  Cramp and spasm  Cervicalgia  Rationale for Evaluation and Treatment: Rehabilitation  ONSET DATE: 01/13/23 seen at urgent care  SUBJECTIVE:                                                                                                                                                                                                         SUBJECTIVE STATEMENT: Pt reports that he has not been taking his medication for the past 4 days, he will see surgeon on 1/13    EVAL: Hurting a lot, don't like to take the oxy too much, just if can't sleep, just trying to get the process going, before neurosurgeon tells me I have to go  through PT, was going to do surgery before through ortho and decided wanted to go through neurosurgeon then ended up moving to [place]  and came back.  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 He reports neck pain and tension, with bilateral radiating numbness and pain down his arms. The pain has been worsening recently due  to the nature of his work, which involves a lot of overhead work. The patient has considered surgery in the past but has been hesitant due to the invasive nature of the proposed procedures. The patient reports that the pain is now worse than before. He historically saw [facility], reports history of MRI and injections through pain management. He is currently taking a muscle relaxant and gabapentin  which he reports is minimally helpful. He is interested in surgical intervention, not more medication.  PMH: Bipolar, PTSD, chronic neck pain, chronic hep C., R carpal tunnel release, R elbow arthroscopy  PAIN:  Are you having pain? Yes: NPRS scale: 8/10 Pain location: varies, halfway down R shoulder  Pain description: pressure, clamping, squeezing  Aggravating factors: overhead movements, lifting weights, looking up  Relieving factors: heat, medication, ibuprofen  PRECAUTIONS: None  RED FLAGS: None     WEIGHT BEARING RESTRICTIONS: No  FALLS:  Has patient fallen in last 6 months? No  LIVING ENVIRONMENT: Lives with: lives with their family Lives in: House/apartment Stairs: No Has following equipment at home: None  OCCUPATION: holiday representative  PLOF: Independent  PATIENT GOALS: feel better   OBJECTIVE:   DIAGNOSTIC FINDINGS:  01/13/2023 Xray of cervical spine with multilevel degenerative changes, worst at C5-C6, C6-C7  MRI not available for review.   PATIENT SURVEYS:  NDI 25/50  COGNITION: Overall  cognitive status: Within functional limits for tasks assessed  SENSATION: Light touch: WFL Reports intermittant tingling in bil hands  POSTURE:  decreased cervical lordosis  PALPATION: Tenderness/tightness throughout cervical paraspinals, UT, levator scapulae mm.  Decreased mobility of cervical spine throughout.    CERVICAL ROM:   Active ROM A/PROM (deg) eval 03/03/23  Flexion 17 20  Extension 21 20  Right lateral flexion    Left lateral flexion    Right rotation 27 26  Left rotation 32 33   (Blank rows = not tested)  UPPER EXTREMITY ROM:  Active ROM Right eval Left eval  Shoulder flexion    Shoulder extension    Shoulder internal rotation    Shoulder external rotation    Elbow flexion    Elbow extension     (Blank rows = not tested)  UPPER EXTREMITY MMT:  MMT Right* eval Left* eval  Shoulder flexion 4+ 4+  Shoulder extension    Shoulder abduction 4+ 4+  Shoulder internal rotation 4+ 4+  Shoulder external rotation 4+ 4+  Elbow flexion 4+ 4+  Elbow extension 4+ 4+  Wrist flexion 4+ 4+  Wrist extension 4+ 4+  Grip strength 85 105   (Blank rows = not tested) *submax due to pain  CERVICAL SPECIAL TESTS:  Spurling's test: Positive, Distraction test: Positive, and Sharp pursor's test: Negative  TODAY'S TREATMENT:                                                                                                                              DATE: 03/10/23 UBE L1.0 2 min- stopped  d/t pain SNAG for cervical extension x 10  Scap retraction x 10  Cervical extension x 10  Manual Therapy: to decrease muscle spasm and pain and improve mobility STM to L UT, LS, scalenes Modalities: TENS to bil UT and mid traps + MHP x 10 min intensity to tolerance  03/03/23 UBE for 5 min alternating forward and backward Seated lat pulls 15# x 10  Standing shld ext 10# x 10  Seated low rows x 15# Standing shoulder rolls 5lb weights x 10  Manual Therapy: to decrease muscle spasm and  pain and improve mobility STM to bil UT, LS, scalenes, suboccipitals  Modalities: TENS to bil UT and mid traps + MHP x 10 min intensity to tolerance   NDI: 23 / 50 = 46.0 %  02/28/23 Self Care: Reviewed MRI results with patient and explained with anatomical model, discussed possible next steps Mechanical  Cervical Traction x only 5 min total treatment time completed after several readjustments at 30 lbs max  60 sec on/10 sec rest, 2 steps up, 2 steps down, with table at 20 deg, to decrease radicular symptoms and pain.   Patient did not find head position comfortable and requested to stop.  Manual Therapy: to decrease muscle spasm and pain and improve mobility STM/TPR to R SCM/anterior scalenes, 1st rib mobilization, cervical paraspinals, suboccipital release, manual cervical traction Modalities: MHP to neck in supine x 10 min (post session, not included in treatment time)  02/20/23 Attempted UBE x - strain on L shoulder and neck UT stretch 2x30 B LS stretch x 30 - increased strain on neck Chin tucks x 10 - isometric seated and supine Scap retraction  Cervical isometrics supine: side bend, rotation,   Manual Therapy: to decrease muscle spasm and pain and improve mobility STM with massage gun to L UT, LS Manual cervical traction 2 bouts Modalities: ESTIM to bil UT + MHP x 10 min intensity to tolerance 80-150Hz   02/17/23 Therapeutic Exercise: to improve strength and mobility.  Demo, verbal and tactile cues throughout for technique. Radial nerve glides - tried 2 versions, to perform without head movements to decrease irritation when hand numb Gentle chin tuck - mostly isometric contraction Isometric scapular squeeze Self snag with pillow case - flexion and rotation to tolerance.  HEP update Manual Therapy: to decrease muscle spasm and pain and improve mobility In prone - STM/TPR to bil UT, L/S, cervical paraspinals, bil rhomboids UPA mobs cervical spine grade 1-2, PA mobs thoracic  spine grade 1-2, skilled palpation and monitoring during dry needling. Trigger Point Dry-Needling  Treatment instructions: Expect mild to moderate muscle soreness. S/S of pneumothorax if dry needled over a lung field, and to seek immediate medical attention should they occur. Patient verbalized understanding of these instructions and education. Patient Consent Given: Yes Education handout provided: Previously provided Muscles treated: bil UT, bil C4, C5 multifidi Electrical stimulation performed: No Parameters: N/A Treatment response/outcome: Twitch Response Elicited and Palpable Increase in Muscle Length     PATIENT EDUCATION:  Education details: anatomy & imaging Person educated: Patient Education method: Explanation and Demonstration Education comprehension: verbalized understanding  HOME EXERCISE PROGRAM: Access Code: [email or web address] URL: [email or web address] Date: 02/17/2023 Prepared by: Almarie Sprinkles  Exercises - Radial Nerve Tensioner  - 1 x daily - 7 x weekly - 1 sets - 5-10 reps - Seated Scapular Retraction  - 1 x daily - 7 x weekly - 1 sets - 5-10 reps - Seated Cervical Retraction  - 1 x daily - 7 x  weekly - 1 sets - 5-10 reps - Cervical Extension AROM with Strap  - 1 x daily - 7 x weekly - 1 sets - 5-10 reps - Seated Assisted Cervical Rotation with Towel  - 1 x daily - 7 x weekly - 1 sets - 5-10 reps  ASSESSMENT:  CLINICAL IMPRESSION: Pt continues to report pain more along his L UT muscle radiating into his L shoulder. He was limited with the UBE warm-up d/t increased pain. He did well with the manual work, still presents with TTP and muscle tension in along the L side of his neck and shoulders. He goes to neurosurgeon on 1/13 to discuss further treatment options. Javonnie Illescas Stoltzfus continues to demonstrate potential for improvement and would benefit from continued skilled therapy to address impairments.      OBJECTIVE IMPAIRMENTS: decreased activity  tolerance, decreased endurance, decreased ROM, decreased strength, hypomobility, increased fascial restrictions, impaired perceived functional ability, increased muscle spasms, impaired sensation, impaired UE functional use, and pain.   ACTIVITY LIMITATIONS: carrying, lifting, bending, sitting, squatting, sleeping, bed mobility, reach over head, and hygiene/grooming  PARTICIPATION LIMITATIONS: meal prep, cleaning, laundry, driving, shopping, community activity, occupation, and yard work  PERSONAL FACTORS: Time since onset of injury/illness/exacerbation and 3+ comorbidities: Bipolar, PTSD, chronic neck pain, chronic hep C.,history of R carpal tunnel release, R elbow arthroscopy  are also affecting patient's functional outcome.   REHAB POTENTIAL: Good  CLINICAL DECISION MAKING: Evolving/moderate complexity  EVALUATION COMPLEXITY: Moderate   GOALS: Goals reviewed with patient? Yes  SHORT TERM GOALS: Target date: 02/23/2023   Patient will be independent with initial HEP.  Baseline: needs Goal status: IN PROGRESS 02/17/23- given  LONG TERM GOALS: Target date: 03/16/2023   Patient will be independent with advanced/ongoing HEP to improve outcomes and carryover.  Baseline:  Goal status: IN PROGRESS  2.  Patient will report 75% improvement in neck pain to improve QOL.  Baseline:  Goal status: IN PROGRESS  3.  Patient will demonstrate full pain free cervical ROM for safety with driving.  Baseline: see objective Goal status: IN PROGRESS- 03/03/23 assessed, see ROM chart  4.  Patient will report at least 8 points improvement on NDI to demonstrate improved functional ability.  Baseline: 25/50 Goal status: IN PROGRESS- 23 / 50 = 46.0 % 03/03/23  5.  Patient will demonstrate 75% improvement in radicular symptoms.  Baseline: numbness and tingling radiate down to both hands Goal status: IN PROGRESS  PLAN:  PT FREQUENCY: 1-2x/week  PT DURATION: 6 weeks  PLANNED INTERVENTIONS: 97164- PT  Re-evaluation, 97110-Therapeutic exercises, 97530- Therapeutic activity, 97112- Neuromuscular re-education, 97535- Self Care, 02859- Manual therapy, 97014- Electrical stimulation (unattended), [id number]- Electrical stimulation (manual), 97035- Ultrasound, 02987- Traction (mechanical), Patient/Family education, Balance training, Taping, Dry Needling, Joint mobilization, Joint manipulation, Spinal manipulation, Spinal mobilization, Cryotherapy, and Moist heat  PLAN FOR NEXT SESSION: HEP,  Sol LITTIE Gaskins, PTA 03/10/23 9:31 AM   PHYSICAL THERAPY DISCHARGE SUMMARY  Visits from Start of Care: 8  Current functional level related to goals / functional outcomes: See above   Remaining deficits: See above   Education / Equipment: HEP  Plan: Patient agrees to discharge.  Patient goals were not met. Patient was denied further visits by insurance and had surgery on 04/18/2023.      Almarie PARAS Sprinkles, PT  04/20/2023 12:53 PM

## 2023-03-13 ENCOUNTER — Ambulatory Visit: Payer: Medicaid Other | Admitting: Physical Therapy

## 2023-03-16 ENCOUNTER — Encounter: Payer: BLUE CROSS/BLUE SHIELD | Admitting: Physical Therapy

## 2023-03-21 ENCOUNTER — Encounter: Payer: Self-pay | Admitting: Physician Assistant

## 2023-03-21 ENCOUNTER — Ambulatory Visit: Payer: Medicaid Other | Admitting: Physician Assistant

## 2023-03-21 VITALS — BP 141/98 | HR 89 | Temp 97.8°F | Ht 72.0 in | Wt 248.5 lb

## 2023-03-21 DIAGNOSIS — M5412 Radiculopathy, cervical region: Secondary | ICD-10-CM | POA: Diagnosis not present

## 2023-03-21 MED ORDER — GABAPENTIN 300 MG PO CAPS: 300.0000 mg | ORAL_CAPSULE | Freq: Three times a day (TID) | ORAL | 0 refills | Status: DC

## 2023-03-21 NOTE — Progress Notes (Signed)
Established patient visit   Patient: Scott Meza   DOB: November 08, 1970   53 y.o. Male  MRN: [id number] Visit Date: 03/21/2023  Today's healthcare provider: Alfredia Ferguson, PA-C   Cc. Increased neck pain after fall.  Subjective     Pt had a interlaminal cervical epidural steroid injection 03/17/23 with pain management through [facility].  On 1/18 he had a fall where he tripped over a cat, and hurt the right side of his neck. Denies head strike. He went to the ED, a CT of his cervical spine was completed without any acute injuries. He was provided percocet and a lidocaine patch in the ED, prescribed flexeril which he d/c d/t drowsiness.  Today, he complains of unbearable pain, that he needs medication, ie opioids, until he sees pain management. He reports not taking any medicine currently, using heat.   When asked why he hasn't been back to PT, he reports he was 'cut off' for insurance reasons. He has not reached back out to the provider that administered the steroid injection.  Medications: Outpatient Medications Prior to Visit  Medication Sig   [DISCONTINUED] gabapentin (NEURONTIN) 300 MG capsule Take 1 capsule (300 mg total) by mouth at bedtime.   [DISCONTINUED] methocarbamol (ROBAXIN) 500 MG tablet Take 1 tablet (500 mg total) by mouth 2 (two) times daily.   No facility-administered medications prior to visit.    Review of Systems  Constitutional:  Negative for fatigue and fever.  Respiratory:  Negative for cough and shortness of breath.   Cardiovascular:  Negative for chest pain, palpitations and leg swelling.  Musculoskeletal:  Positive for neck pain.  Neurological:  Negative for dizziness and headaches.       Objective    BP (!) 141/98   Pulse 89   Temp 97.8 F (36.6 C) (Oral)   Ht 6' (1.829 m)   Wt 248 lb 8 oz (112.7 kg)   SpO2 95%   BMI 33.70 kg/m    Physical Exam Vitals reviewed.  Constitutional:      Appearance: He is not ill-appearing.   HENT:     Head: Normocephalic.  Eyes:     Conjunctiva/sclera: Conjunctivae normal.  Cardiovascular:     Rate and Rhythm: Normal rate.  Pulmonary:     Effort: Pulmonary effort is normal. No respiratory distress.  Neurological:     General: No focal deficit present.     Mental Status: He is alert and oriented to person, place, and time.  Psychiatric:        Mood and Affect: Mood normal.        Behavior: Behavior is agitated.     No results found for any visits on 03/21/23.  Assessment & Plan    Cervical radiculopathy Assessment & Plan: At this point I am unfortunately suspicious of drug seeking behavior. He has not taken or failed medications other than opioids. He has an appt with pain management 1/30. Expressed my sympathy that he is in pain, will provide gabapentin rx.  Recommending heat, stretches. Recommending he return to PT, call the pain management provider who administered the steroid injection.   Orders: -     Gabapentin; Take 1 capsule (300 mg total) by mouth 3 (three) times daily.  Dispense: 60 capsule; Refill: 0   Return if symptoms worsen or fail to improve.       Alfredia Ferguson, PA-C  [facility] [phone] (phone) [phone] (fax)  [facility]

## 2023-03-21 NOTE — Assessment & Plan Note (Addendum)
At this point I am unfortunately suspicious of drug seeking behavior. He has not taken or failed medications other than opioids. He has an appt with pain management 1/30. Expressed my sympathy that he is in pain, will provide gabapentin rx.  Recommending heat, stretches. Recommending he return to PT, call the pain management provider who administered the steroid injection.

## 2023-03-28 ENCOUNTER — Encounter: Payer: Medicaid Other | Attending: Physical Medicine & Rehabilitation | Admitting: Physical Medicine & Rehabilitation

## 2023-03-28 ENCOUNTER — Encounter: Payer: Self-pay | Admitting: Physical Medicine & Rehabilitation

## 2023-03-28 VITALS — BP 147/103 | HR 81 | Ht 72.0 in | Wt 248.0 lb

## 2023-03-28 DIAGNOSIS — G8929 Other chronic pain: Secondary | ICD-10-CM | POA: Insufficient documentation

## 2023-03-28 DIAGNOSIS — M542 Cervicalgia: Secondary | ICD-10-CM | POA: Diagnosis present

## 2023-03-28 MED ORDER — OXYCODONE HCL 5 MG PO TABS: 5.0000 mg | ORAL_TABLET | Freq: Two times a day (BID) | ORAL | 0 refills | Status: DC | PRN

## 2023-03-28 NOTE — Progress Notes (Signed)
Subjective:    Patient ID: Scott Meza, male    DOB: [birth year], 53 y.o.   MRN: [id number]  HPI  CC:  Neck pain 53 year old male with a cervical spinal stenosis.  He has been evaluated by orthopedic spine surgeon Dr. Venita Lick in 2023 at [facility] and has undergone injections with Dr. Sheran Luz. Sees Dr Petra Kuba NS in [place], has surgery scheduled on 04/18/2023  Takes Ibuprofen for pain   Pain goes down either left or right arm to the lateral 3 digits  Has tried Gabapentin (on this now) not sure this helps Has tried flexeril in past feels tired in the am Tried prednisone pack which she does not think is adequate to relieve his pain right now He states that the oxycodone prescribed by his primary care physician was helpful. 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  He is not doing any heavy work at this time  Larey Seat and went to [facility] in [place] CT neck no acute changes  03/17/23- Cervical ESI C7-T1 at [facility] - pt states it is not helpful   Denies ETOH use Denies IVDA Has a history of hepatitis C but does not know how he got it  Date of service: 03/17/2023  Procedure: 1. Interlaminar cervical epidural steroid injection 2. Fluoroscopy  Surgeon: Wynonia Lawman, MD  Anesthesia: Local  Preoperative diagnoses: Cervical DDD, cervical stenosis, cervical radiculopathy  Postoperative diagnoses: Cervical DDD, cervical stenosis, cervical radiculopathy  Complications: None  Blood loss: Minimal  Therapeutic agent: 2 cc sterile saline +10mg  Decadron and 1cc isovue contrast  Description of procedure:  Patient was identified in the exam room prior to the procedure. Informed consent including discussion of risks benefits and alternatives to the procedure were obtained. Patient was then taken to the fluoroscopy suite where she was positioned in the prone position.  Patient's cervical neck was prepped and draped in sterile fashion area AP fluoroscopy was used to identify the C7-T1 interspace. Local anesthetic was used to numb locally. An 18-gauge Touhy needle was then advanced to the C7-T1 interspace using intermittent fluoroscopic guidance. Once in ligamentum flavum, loss-of-resistance technique was used to enter the posterior epidural space along with lateral fluoroscopic guidance. Once in the epidural space aspiration was negative for heme or air. Radio-opaque contrast was injected that showed spread in the epidural space but no vascular uptake. Needle was then injected with therapeutic agent removed intact. Patient was recovered for 15 minutes prior to discharge without complication.  Addendum: Fluoroscopy time and Exposure, mRad were recorded and scanned into chart for procedure. Image count stored. Electronically signed by Nilda Simmer, MD at 03/17/2023 9:52 AM EST  TECHNIQUE: MRI cervical spine without contrast.   COMPARISON:None   FINDINGS:   The bone marrow signal is normal.   The craniocervical junction is normal.   The cervical spinal cord is normal.   C2-C3: Normal   C3-C4: There is a mild disc bulge. The facet joints are normal. No spinal stenosis or foraminal stenosis   C4-C5: There is a mild disc bulge. There is mild facet arthropathy on the left. No spinal stenosis or significant foraminal stenosis   C5-C6: There is a mild disc bulge. There are bilateral foraminal spurs. The facet joints are normal. No spinal stenosis. There is moderate bilateral neuroforaminal stenosis   C6-C7: There is severe degenerative disc disease. There is a prominent foraminal spur on the  right. The facet joints are normal. There is moderate right neuroforaminal stenosis   C7-T1: Normal Procedure Note  Sherron Ales, MD - 02/25/2023  Formatting of this note might be different from the original.  EXAMINATION: MRI cervical spine   CLINICAL  INDICATION:Chronic neck pain   TECHNIQUE: MRI cervical spine without contrast.   COMPARISON:None   FINDINGS:   The bone marrow signal is normal.   The craniocervical junction is normal.   The cervical spinal cord is normal.   C2-C3: Normal   C3-C4: There is a mild disc bulge. The facet joints are normal. No spinal stenosis or foraminal stenosis   C4-C5: There is a mild disc bulge. There is mild facet arthropathy on the left. No spinal stenosis or significant foraminal stenosis   C5-C6: There is a mild disc bulge. There are bilateral foraminal spurs. The facet joints are normal. No spinal stenosis. There is moderate bilateral neuroforaminal stenosis   C6-C7: There is severe degenerative disc disease. There is a prominent foraminal spur on the right. The facet joints are normal. There is moderate right neuroforaminal stenosis   C7-T1: Normal    IMPRESSION:   Bilateral foraminal spurs at C5-6 with moderate bilateral neuroforaminal stenosis.   Severe degenerative disc disease at C6-7 with prominent foraminal spur on the right with moderate right neuroforaminal stenosis.   Otherwise mild degenerative changes.   Electronically Signed by: Domingo Dimes, MD on 02/25/2023 1:50 PM,  Pain Inventory Average Pain 9 Pain Right Now 10 My pain is constant, sharp, and stabbing  In the last 24 hours, has pain interfered with the following? General activity 10 Relation with others 7 Enjoyment of life 8 What TIME of day is your pain at its worst? morning , daytime, evening, and night Sleep (in general) Poor  Pain is worse with: walking, bending, sitting, standing, and some activites Pain improves with: heat/ice and medication Relief from Meds: 7  walk without assistance ability to climb steps?  yes do you drive?  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  no  Primary care lindsay  [facility]--going to have surgery 04/18/23 in [place]    Family History  Adopted: Yes   Social History   Socioeconomic History   Marital status: Single    Spouse name: Not on file   Number of children: Not on file   Years of education: Not on file   Highest education level: Not on file  Occupational History   Not on file  Tobacco Use   Smoking status: Former   Smokeless tobacco: Former    Types: Chew  Substance and Sexual Activity   Alcohol use: Not Currently    Comment: 2 weeks ago drank twice    Drug use: Not Currently    Types: Marijuana    Comment: smoked a marijuna joint last week.    Sexual activity: Yes    Partners: Female  Other Topics Concern   Not on file  Social History Narrative   Not on file   Social Drivers of Health   Financial Resource Strain: High Risk (09/07/2021)   Received from [facility], [facility]   Overall Financial Resource Strain (CARDIA)    Difficulty of Paying Living Expenses: Very hard  Food Insecurity: Food Insecurity Present (09/07/2021)   Received from [facility], [facility]   Hunger Vital Sign    Worried About Running Out  of Food in the Last Year: Often true    Ran Out of Food in the Last Year: Often true  Transportation Needs: No Transportation Needs (09/07/2021)   Received from [facility], [facility]   PRAPARE - Transportation    Lack of Transportation (Medical): No    Lack of Transportation (Non-Medical): No  Physical Activity: Sufficiently Active (09/07/2021)   Received from [facility], [facility]   Exercise Vital Sign    Days of Exercise per Week: 2 days    Minutes of Exercise per Session: 80 min  Stress: Stress Concern Present (09/07/2021)   Received from [facility], [facility] - Occupational Stress Questionnaire    Feeling of Stress : Very much  Social Connections: Unknown (09/12/2022)   Received from [facility]   Social  Network    Social Network: Not on file   Past Surgical History:  Procedure Laterality Date   CARPAL TUNNEL RELEASE Right    ELBOW ARTHROSCOPY Right    HEMORROIDECTOMY     Past Medical History:  Diagnosis Date   Anxiety    Cervical radiculopathy    BP (!) 147/103   Pulse 81   Ht 6' (1.829 m)   Wt 248 lb (112.5 kg)   SpO2 97%   BMI 33.63 kg/m   Opioid Risk Score:   Fall Risk Score:  `1  Depression screen [facility] 2/9     03/28/2023    1:35 PM 01/24/2022   11:37 AM 07/20/2020   11:20 AM  Depression screen PHQ 2/9  Decreased Interest 0 1   Down, Depressed, Hopeless 0 0   PHQ - 2 Score 0 1   Altered sleeping 3 1   Tired, decreased energy 3 3   Change in appetite 0 2   Feeling bad or failure about yourself  0 1   Trouble concentrating 1 1   Moving slowly or fidgety/restless 0 1   Suicidal thoughts 0 0   PHQ-9 Score 7 10   Difficult doing work/chores  Somewhat difficult      Information is confidential and restricted. Go to Review Flowsheets to unlock data.    Review of Systems  Musculoskeletal:  Positive for neck pain.  Neurological:  Positive for numbness.  All other systems reviewed and are negative.     Objective:   Physical Exam Vitals reviewed.  Constitutional:      Appearance: He is normal weight.  HENT:     Head: Normocephalic and atraumatic.  Eyes:     Extraocular Movements: Extraocular movements intact.     Conjunctiva/sclera: Conjunctivae normal.     Pupils: Pupils are equal, round, and reactive to light.  Musculoskeletal:     Cervical back: Tenderness present.  Neurological:     General: No focal deficit present.     Mental Status: He is alert and oriented to person, place, and time.     Cranial Nerves: No dysarthria or facial asymmetry.     Motor: No tremor, atrophy or abnormal muscle tone.     Coordination: Coordination is intact.     Gait: Gait is intact.     Deep Tendon Reflexes:     Reflex Scores:      Tricep reflexes are 0 on the right  side and 0 on the left side.      Bicep reflexes are 2+ on the right side and 0 on the left side.      Brachioradialis reflexes are  0 on the right side and 0 on the left side. Psychiatric:        Mood and Affect: Mood normal.        Behavior: Behavior normal.   Motor strength is 5/5 bilateral deltoid bicep tricep grip as well as hip flexor knee extensor ankle dorsiflexor Negative straight leg raise bilaterally Tone is normal in the upper extremities Reduced cervical spine range of motion flexion extension lateral bending and rotation approximate 25% of normal related to pain        Assessment & Plan:   #1.  History of cervical spinal stenosis has intermittent radicular symptoms, recent MRI demonstrating degenerative disc C6-7 greater than C5-6 with foraminal stenosis but no central stenosis.  No evidence of cervical myelopathy. His primary complaint is neck pain.  He has surgery in 3 weeks.  He is already established with [facility].  I spoke to his primary physician I would give him 60 tablets of oxycodone 5 mg to get him through until he has surgery.  His surgeon will manage his pain postoperatively and if he needs any further pain management postop I would recommend going to [facility] where he is already established and has had procedures.  A controlled substance agreement was not signed today. PDMP was reviewed and only recorded medications are from his PCP.

## 2023-03-29 ENCOUNTER — Telehealth: Payer: Self-pay

## 2023-03-29 NOTE — Telephone Encounter (Signed)
Approved 03/29/2023-09/25/2023

## 2023-03-29 NOTE — Telephone Encounter (Signed)
PA submitted for Oxycodone  Scott Meza (Key: [id number])

## 2023-03-30 ENCOUNTER — Encounter: Payer: Medicaid Other | Admitting: Physical Medicine & Rehabilitation

## 2023-04-18 HISTORY — PX: ANTERIOR CERVICAL DECOMP/DISCECTOMY FUSION: SHX1161

## 2023-05-09 ENCOUNTER — Encounter: Payer: Self-pay | Admitting: Physical Therapy

## 2023-05-09 ENCOUNTER — Other Ambulatory Visit: Payer: Self-pay

## 2023-05-09 ENCOUNTER — Ambulatory Visit: Attending: Physician Assistant | Admitting: Physical Therapy

## 2023-05-09 DIAGNOSIS — M6281 Muscle weakness (generalized): Secondary | ICD-10-CM | POA: Diagnosis present

## 2023-05-09 DIAGNOSIS — M5412 Radiculopathy, cervical region: Secondary | ICD-10-CM | POA: Insufficient documentation

## 2023-05-09 DIAGNOSIS — R252 Cramp and spasm: Secondary | ICD-10-CM | POA: Diagnosis present

## 2023-05-09 DIAGNOSIS — M542 Cervicalgia: Secondary | ICD-10-CM | POA: Insufficient documentation

## 2023-05-09 NOTE — Therapy (Signed)
 OUTPATIENT PHYSICAL THERAPY CERVICAL EVALUATION   Patient Name: Scott Meza MRN: [id number] DOB:November 08, 1970, 53 y.o., male Today's Date: 05/09/2023  END OF SESSION:  PT End of Session - 05/09/23 0859     Visit Number 1    Authorization Type [employer]    Authorization Time Period pending    PT Start Time 0850    PT Stop Time 0940    PT Time Calculation (min) 50 min    Activity Tolerance Patient tolerated treatment well    Behavior During Therapy [facility] for tasks assessed/performed             Past Medical History:  Diagnosis Date   Anxiety    Cervical radiculopathy    Past Surgical History:  Procedure Laterality Date   ANTERIOR CERVICAL DECOMP/DISCECTOMY FUSION  04/18/2023   C5-7   CARPAL TUNNEL RELEASE Right    ELBOW ARTHROSCOPY Right    HEMORROIDECTOMY     Patient Active Problem List   Diagnosis Date Noted   Nocturia 01/17/2023   Chronic hepatitis C without hepatic coma (HCC) 01/17/2023   Cervical radiculopathy 04/06/2021   OA (osteoarthritis) of knee 08/07/2020   Bipolar 2 disorder, major depressive episode (HCC) 07/20/2020   GAD (generalized anxiety disorder) 07/20/2020   PTSD (post-traumatic stress disorder) 07/20/2020    PCP: Alfredia Ferguson, PA-C   REFERRING PROVIDER: Jonetta Speak, NP  REFERRING DIAG: M54.12 cervical radiculopathy M50.3 Degeneration of cervical intervertebral disk  THERAPY DIAG:  Radiculopathy, cervical region  Cramp and spasm  Muscle weakness (generalized)  Rationale for Evaluation and Treatment: Rehabilitation  ONSET DATE: 04/18/23  SUBJECTIVE:                                                                                                                                                                                                         SUBJECTIVE STATEMENT: Patient had surgery but still having a lot of pain.  Last night was horrible, worse than before the surgery, both arms went numb, turned his  head and heard a pop, and it just hurts.  Hands feel tight.  Hand dominance: Right  PERTINENT HISTORY:  ACDF L5-7 on 04/18/23; PMH: Bipolar, PTSD, chronic hep C., R carpal tunnel release, R elbow arthroscopy   PAIN:  Are you having pain? Yes: NPRS scale: 7/10 Pain location: L upper shoulder/neck Pain description: pulling Aggravating factors: moving neck Relieving factors: keeping neck still, medication, heat  PRECAUTIONS: Cervical - lifting < 15 lbs for 6 weeks, limit overhead activities.   RED FLAGS: None     WEIGHT  BEARING RESTRICTIONS: No  FALLS:  Has patient fallen in last 6 months? No  LIVING ENVIRONMENT: Lives with: lives with their family Lives in: House/apartment Stairs: No Has following equipment at home: None  OCCUPATION: Holiday representative  PLOF: Independent  PATIENT GOALS: decrease pain, be able to work  NEXT MD VISIT: 6 weeks  OBJECTIVE:   DIAGNOSTIC FINDINGS:  05/05/2023 XR spine cervical  IMPRESSION:   1.  No acute radiographic findings.  2.  C5-C6 and C6-C7 ACDF.   PATIENT SURVEYS:  NDI 37/50  COGNITION: Overall cognitive status: Within functional limits for tasks assessed  SENSATION: Light touch: Impaired  in fingers  POSTURE: decreased cervical lordosis  PALPATION: Tenderness/spasm throughout cervical paraspinals, UT, levator scapulae bilaterally, L>R   CERVICAL ROM:   Active ROM A/PROM (deg) eval  Flexion 15p!  Extension 15p!  Right lateral flexion   Left lateral flexion   Right rotation 33p!  Left rotation 35p!   (Blank rows = not tested)  UPPER EXTREMITY ROM:  Active ROM Right eval Left eval  Shoulder flexion 135 130  Shoulder abduction 100 85   (Blank rows = not tested)  UPPER EXTREMITY MMT:  MMT Right eval Left eval  Shoulder flexion 5 5  Shoulder extension    Shoulder abduction 5 5  Shoulder internal rotation 5 5  Shoulder external rotation 5 5  Elbow flexion 5 5  Elbow extension 5 5  Wrist flexion 5 5  Wrist  extension 5 5  Grip strength 60lbs 55lbs   (Blank rows = not tested)  CERVICAL SPECIAL TESTS:  NT  FUNCTIONAL TESTS:  NT   TODAY'S TREATMENT:                                                                                                                              DATE:   05/09/23 EVAL Self Care: Findings, POC, education on PNS, recommendation for gentle ROM of neck and shoulders within pain free ROM, start slow Modalities: MHP to neck in supine x 8 min     PATIENT EDUCATION:  Education details: see self care Person educated: Patient Education method: Explanation, Demonstration, and Verbal cues Education comprehension: verbalized understanding  HOME EXERCISE PROGRAM: TBD  ASSESSMENT:  CLINICAL IMPRESSION: Patient is a 53 y.o. right hand dominant male who was seen today for physical therapy evaluation and treatment for s/p ACDF on 04/18/2023. He was seen for neck pain prior to surgery.  He reports that symptoms were improved by surgery but moved neck wrong and is currently having increased spasms in neck and increased pain, and still wakes with numbness in hands.  Reassured today about healing process and encouraged to perform gentle ROM in pain free ROM, ok to use heat or cold to help with spasms, applied MHP at end of session to relax muscles.  He did have significant spasms today in L UT/LS corresponding to pain.  He brought in exercises that a friend gave him for his  shoulders, these were fine to start slowly in pain free ROM, but to pick just a couple to start.  Scott Meza would benefit from skilled physical therapy to improve cervical and shoulder ROM, strength, and activity tolerance in order to improve QOL and allow return to work.      OBJECTIVE IMPAIRMENTS: decreased activity tolerance, decreased endurance, decreased ROM, decreased strength, increased edema, increased fascial restrictions, impaired perceived functional ability, increased muscle spasms,  impaired sensation, impaired UE functional use, postural dysfunction, and pain.   ACTIVITY LIMITATIONS: carrying, lifting, bending, sitting, standing, sleeping, transfers, dressing, reach over head, hygiene/grooming, and caring for others  PARTICIPATION LIMITATIONS: meal prep, cleaning, laundry, driving, shopping, community activity, occupation, and yard work  PERSONAL FACTORS: Past/current experiences, Time since onset of injury/illness/exacerbation, and 1-2 comorbidities:  ACDF, Bipolar, PTSD, chronic hep C., R carpal tunnel release,  are also affecting patient's functional outcome.   REHAB POTENTIAL: Good  CLINICAL DECISION MAKING: Evolving/moderate complexity  EVALUATION COMPLEXITY: Moderate   GOALS: Goals reviewed with patient? Yes  SHORT TERM GOALS: Target date: 05/23/2023   Patient will be independent with initial HEP.  Baseline:  Goal status: INITIAL    LONG TERM GOALS: Target date: 06/20/2023   Patient will be independent with advanced/ongoing HEP to improve outcomes and carryover.  Baseline:  Goal status: INITIAL  2.  Patient will report 75% improvement in neck pain to improve QOL.  Baseline: 7/10 Goal status: INITIAL  3.  Patient will demonstrate full pain free cervical ROM for safety with driving.  Baseline: see objective Goal status: INITIAL  4.  Patient will report at least 8 points improvement on NDI to demonstrate improved functional ability.  Baseline: 37/50 Goal status: INITIAL  5.  Patient will demonstrate 75% improvement in radicular symptoms.  Baseline: still getting numbness/tingling down both hands especially in morning Goal status: INITIAL  6. Patient will demonstrate improved grip strength to 75lbs bil. To perform job duties safely.  Baseline: 60lbs R, 55lbs L Goal status: INITIAL      PLAN:  PT FREQUENCY: 2x/week  PT DURATION: 6 weeks  PLANNED INTERVENTIONS: 97110-Therapeutic exercises, 97530- Therapeutic activity, [id number]  Neuromuscular re-education, 97535- Self Care, 16109- Manual therapy, [id number]- Gait training, [id number]- Ultrasound, Joint mobilization, Joint manipulation, Cryotherapy, and Moist heat  PLAN FOR NEXT SESSION: gentle cervical ROM, postural/core strengthening, grip strengthening, manual therapy, modalities PRN.     Jena Gauss, PT, DPT 05/09/2023, 11:37 AM

## 2023-05-15 ENCOUNTER — Ambulatory Visit

## 2023-05-15 DIAGNOSIS — M6281 Muscle weakness (generalized): Secondary | ICD-10-CM

## 2023-05-15 DIAGNOSIS — M5412 Radiculopathy, cervical region: Secondary | ICD-10-CM | POA: Diagnosis not present

## 2023-05-15 DIAGNOSIS — R252 Cramp and spasm: Secondary | ICD-10-CM

## 2023-05-15 DIAGNOSIS — M542 Cervicalgia: Secondary | ICD-10-CM

## 2023-05-15 NOTE — Therapy (Signed)
 OUTPATIENT PHYSICAL THERAPY CERVICAL TREATMENT   Patient Name: Scott Meza MRN: [id number] DOB:27-Oct-1970, 53 y.o., male Today's Date: 05/15/2023  END OF SESSION:  PT End of Session - 05/15/23 0930     Visit Number 2    Authorization Type [employer]    Authorization Time Period pending    PT Start Time [phone]    PT Stop Time 0930    PT Time Calculation (min) 41 min    Activity Tolerance Patient tolerated treatment well    Behavior During Therapy [facility] for tasks assessed/performed              Past Medical History:  Diagnosis Date   Anxiety    Cervical radiculopathy    Past Surgical History:  Procedure Laterality Date   ANTERIOR CERVICAL DECOMP/DISCECTOMY FUSION  04/18/2023   C5-7   CARPAL TUNNEL RELEASE Right    ELBOW ARTHROSCOPY Right    HEMORROIDECTOMY     Patient Active Problem List   Diagnosis Date Noted   Nocturia 01/17/2023   Chronic hepatitis C without hepatic coma (HCC) 01/17/2023   Cervical radiculopathy 04/06/2021   OA (osteoarthritis) of knee 08/07/2020   Bipolar 2 disorder, major depressive episode (HCC) 07/20/2020   GAD (generalized anxiety disorder) 07/20/2020   PTSD (post-traumatic stress disorder) 07/20/2020    PCP: Scott Ferguson, PA-C   REFERRING PROVIDER: Alfredia Ferguson, PA-C  REFERRING DIAG: [id number] cervical radiculopathy M50.3 Degeneration of cervical intervertebral disk  THERAPY DIAG:  Radiculopathy, cervical region  Cramp and spasm  Muscle weakness (generalized)  Cervicalgia  Rationale for Evaluation and Treatment: Rehabilitation  ONSET DATE: 04/18/23  SUBJECTIVE:                                                                                                                                                                                                         SUBJECTIVE STATEMENT: Pt reports he went to ED for chest pain, they did all the test and it was negative for chest issues Hand dominance:  Right  PERTINENT HISTORY:  ACDF L5-7 on 04/18/23; PMH: Bipolar, PTSD, chronic hep C., R carpal tunnel release, R elbow arthroscopy   PAIN:  Are you having pain? Yes: NPRS scale: 0/10 Pain location: L upper shoulder/neck Pain description: pulling Aggravating factors: moving neck Relieving factors: keeping neck still, medication, heat  PRECAUTIONS: Cervical - lifting < 15 lbs for 6 weeks, limit overhead activities.   RED FLAGS: None     WEIGHT BEARING RESTRICTIONS: No  FALLS:  Has patient fallen in last 6 months? No  LIVING ENVIRONMENT:  Lives with: lives with their family Lives in: House/apartment Stairs: No Has following equipment at home: None  OCCUPATION: Holiday representative  PLOF: Independent  PATIENT GOALS: decrease pain, be able to work  NEXT MD VISIT: 6 weeks  OBJECTIVE:   DIAGNOSTIC FINDINGS:  05/05/2023 XR spine cervical  IMPRESSION:   1.  No acute radiographic findings.  2.  C5-C6 and C6-C7 ACDF.   PATIENT SURVEYS:  NDI 37/50  COGNITION: Overall cognitive status: Within functional limits for tasks assessed  SENSATION: Light touch: Impaired  in fingers  POSTURE:  decreased cervical lordosis  PALPATION: Tenderness/spasm throughout cervical paraspinals, UT, levator scapulae bilaterally, L>R   CERVICAL ROM:   Active ROM A/PROM (deg) eval  Flexion 15p!  Extension 15p!  Right lateral flexion   Left lateral flexion   Right rotation 33p!  Left rotation 35p!   (Blank rows = not tested)  UPPER EXTREMITY ROM:  Active ROM Right eval Left eval  Shoulder flexion 135 130  Shoulder abduction 100 85   (Blank rows = not tested)  UPPER EXTREMITY MMT:  MMT Right eval Left eval  Shoulder flexion 5 5  Shoulder extension    Shoulder abduction 5 5  Shoulder internal rotation 5 5  Shoulder external rotation 5 5  Elbow flexion 5 5  Elbow extension 5 5  Wrist flexion 5 5  Wrist extension 5 5  Grip strength 60lbs 55lbs   (Blank rows = not  tested)  CERVICAL SPECIAL TESTS:  NT  FUNCTIONAL TESTS:  NT   TODAY'S TREATMENT:                                                                                                                              DATE:  05/15/23 Therapeutic Exercise: to improve strength, ROM, flexibility, and endurance  UBE L1.0 3 min each way Shoulder rolls x 10 Standing B ER RTB X 10  Standing B horizontal ABD RTB X 10  SNAG cervical extension x 10  SNAG to cervical rotation x 10 both wayus Manual Therapy: to decrease muscle spasm, pain and improve mobility.  STM to L UT, LS, rhomboids  05/09/23 EVAL Self Care: Findings, POC, education on PNS, recommendation for gentle ROM of neck and shoulders within pain free ROM, start slow Modalities: MHP to neck in supine x 8 min     PATIENT EDUCATION:  Education details: see self care Person educated: Patient Education method: Explanation, Demonstration, and Verbal cues Education comprehension: verbalized understanding  HOME EXERCISE PROGRAM: Access Code: [email or web address] URL: [email or web address] Date: 05/15/2023 Prepared by: Scott Meza  Exercises - Shoulder External Rotation and Scapular Retraction with Resistance  - 1 x daily - 3 x weekly - 2 sets - 10 reps - Standing Shoulder Horizontal Abduction with Resistance  - 1 x daily - 3 x weekly - 2 sets - 10 reps - Cervical Extension AROM with Strap  - 1 x daily - 7 x weekly - 2 sets - 10  reps - Seated Assisted Cervical Rotation with Towel  - 1 x daily - 7 x weekly - 2 sets - 10 reps  ASSESSMENT:  CLINICAL IMPRESSION: Patient is a 53 y.o. right hand dominant male who was seen today for physical therapy treatment for s/p ACDF on 04/18/2023. He overall doesn't have much pain, he more repots stiffness. Good tolerance for exercises with minor cues required. Scott Meza would benefit from skilled physical therapy to improve cervical and shoulder ROM, strength, and activity tolerance in  order to improve QOL and allow return to work.      OBJECTIVE IMPAIRMENTS: decreased activity tolerance, decreased endurance, decreased ROM, decreased strength, increased edema, increased fascial restrictions, impaired perceived functional ability, increased muscle spasms, impaired sensation, impaired UE functional use, postural dysfunction, and pain.   ACTIVITY LIMITATIONS: carrying, lifting, bending, sitting, standing, sleeping, transfers, dressing, reach over head, hygiene/grooming, and caring for others  PARTICIPATION LIMITATIONS: meal prep, cleaning, laundry, driving, shopping, community activity, occupation, and yard work  PERSONAL FACTORS: Past/current experiences, Time since onset of injury/illness/exacerbation, and 1-2 comorbidities:  ACDF, Bipolar, PTSD, chronic hep C., R carpal tunnel release,   are also affecting patient's functional outcome.   REHAB POTENTIAL: Good  CLINICAL DECISION MAKING: Evolving/moderate complexity  EVALUATION COMPLEXITY: Moderate   GOALS: Goals reviewed with patient? Yes  SHORT TERM GOALS: Target date: 05/23/2023   Patient will be independent with initial HEP.  Baseline:  Goal status: INITIAL    LONG TERM GOALS: Target date: 06/20/2023   Patient will be independent with advanced/ongoing HEP to improve outcomes and carryover.  Baseline:  Goal status: INITIAL  2.  Patient will report 75% improvement in neck pain to improve QOL.  Baseline: 7/10 Goal status: INITIAL  3.  Patient will demonstrate full pain free cervical ROM for safety with driving.  Baseline: see objective Goal status: INITIAL  4.  Patient will report at least 8 points improvement on NDI to demonstrate improved functional ability.  Baseline: 37/50 Goal status: INITIAL  5.  Patient will demonstrate 75% improvement in radicular symptoms.  Baseline: still getting numbness/tingling down both hands especially in morning Goal status: INITIAL  6. Patient will demonstrate  improved grip strength to 75lbs bil. To perform job duties safely.  Baseline: 60lbs R, 55lbs L Goal status: INITIAL      PLAN:  PT FREQUENCY: 2x/week  PT DURATION: 6 weeks  PLANNED INTERVENTIONS: 97110-Therapeutic exercises, 97530- Therapeutic activity, [id number]- Neuromuscular re-education, 97535- Self Care, [id number]- Manual therapy, [id number]- Gait training, [phone]- Ultrasound, Joint mobilization, Joint manipulation, Cryotherapy, and Moist heat  PLAN FOR NEXT SESSION: gentle cervical ROM, postural/core strengthening, grip strengthening, manual therapy, modalities PRN.     Darleene Cleaver, PTA 05/15/2023, 9:36 AM

## 2023-05-18 ENCOUNTER — Ambulatory Visit: Admitting: Physical Therapy

## 2023-05-19 ENCOUNTER — Encounter: Payer: Self-pay | Admitting: Physical Therapy

## 2023-05-19 ENCOUNTER — Ambulatory Visit: Admitting: Physical Therapy

## 2023-05-19 DIAGNOSIS — M5412 Radiculopathy, cervical region: Secondary | ICD-10-CM | POA: Diagnosis not present

## 2023-05-19 DIAGNOSIS — R252 Cramp and spasm: Secondary | ICD-10-CM

## 2023-05-19 DIAGNOSIS — M6281 Muscle weakness (generalized): Secondary | ICD-10-CM

## 2023-05-19 DIAGNOSIS — M542 Cervicalgia: Secondary | ICD-10-CM

## 2023-05-19 NOTE — Therapy (Signed)
 OUTPATIENT PHYSICAL THERAPY CERVICAL TREATMENT   Patient Name: Scott Meza MRN: [id number] DOB:1971-01-14, 53 y.o., male Today's Date: 05/19/2023  END OF SESSION:  PT End of Session - 05/19/23 0803     Visit Number 3    Authorization Type [employer]    Authorization Time Period pending    PT Start Time 0803    Activity Tolerance Patient tolerated treatment well    Behavior During Therapy [facility] for tasks assessed/performed              Past Medical History:  Diagnosis Date   Anxiety    Cervical radiculopathy    Past Surgical History:  Procedure Laterality Date   ANTERIOR CERVICAL DECOMP/DISCECTOMY FUSION  04/18/2023   C5-7   CARPAL TUNNEL RELEASE Right    ELBOW ARTHROSCOPY Right    HEMORROIDECTOMY     Patient Active Problem List   Diagnosis Date Noted   Nocturia 01/17/2023   Chronic hepatitis C without hepatic coma (HCC) 01/17/2023   Cervical radiculopathy 04/06/2021   OA (osteoarthritis) of knee 08/07/2020   Bipolar 2 disorder, major depressive episode (HCC) 07/20/2020   GAD (generalized anxiety disorder) 07/20/2020   PTSD (post-traumatic stress disorder) 07/20/2020    PCP: Alfredia Ferguson, PA-C   REFERRING PROVIDER: Alfredia Ferguson, PA-C  REFERRING DIAG: [phone] cervical radiculopathy M50.3 Degeneration of cervical intervertebral disk  THERAPY DIAG:  Radiculopathy, cervical region  Cramp and spasm  Muscle weakness (generalized)  Cervicalgia  Rationale for Evaluation and Treatment: Rehabilitation  ONSET DATE: 04/18/23  SUBJECTIVE:                                                                                                                                                                                                         SUBJECTIVE STATEMENT: Today doing ok, yesteday was hurting because driving [place], and slipped stepping up into the truck.  Taking less and less tylenol and sleeping better.   Hand dominance:  Right  PERTINENT HISTORY:  ACDF L5-7 on 04/18/23; PMH: Bipolar, PTSD, chronic hep C., R carpal tunnel release, R elbow arthroscopy   PAIN:  Are you having pain? Yes: NPRS scale: 0/10 Pain location: L upper shoulder/neck Pain description: pulling Aggravating factors: moving neck Relieving factors: keeping neck still, medication, heat  PRECAUTIONS: Cervical - lifting < 15 lbs for 6 weeks, limit overhead activities.   RED FLAGS: None     WEIGHT BEARING RESTRICTIONS: No  FALLS:  Has patient fallen in last 6 months? No  LIVING ENVIRONMENT: Lives with: lives with their family Lives  in: House/apartment Stairs: No Has following equipment at home: None  OCCUPATION: Holiday representative  PLOF: Independent  PATIENT GOALS: decrease pain, be able to work  NEXT MD VISIT: 6 weeks  OBJECTIVE:   DIAGNOSTIC FINDINGS:  05/05/2023 XR spine cervical  IMPRESSION:   1.  No acute radiographic findings.  2.  C5-C6 and C6-C7 ACDF.   PATIENT SURVEYS:  NDI 37/50  COGNITION: Overall cognitive status: Within functional limits for tasks assessed  SENSATION: Light touch: Impaired  in fingers  POSTURE:  decreased cervical lordosis  PALPATION: Tenderness/spasm throughout cervical paraspinals, UT, levator scapulae bilaterally, L>R   CERVICAL ROM:   Active ROM A/PROM (deg) eval  Flexion 15p!  Extension 15p!  Right lateral flexion   Left lateral flexion   Right rotation 33p!  Left rotation 35p!   (Blank rows = not tested)  UPPER EXTREMITY ROM:  Active ROM Right eval Left eval  Shoulder flexion 135 130  Shoulder abduction 100 85   (Blank rows = not tested)  UPPER EXTREMITY MMT:  MMT Right eval Left eval  Shoulder flexion 5 5  Shoulder extension    Shoulder abduction 5 5  Shoulder internal rotation 5 5  Shoulder external rotation 5 5  Elbow flexion 5 5  Elbow extension 5 5  Wrist flexion 5 5  Wrist extension 5 5  Grip strength 60lbs 55lbs   (Blank rows = not  tested)  CERVICAL SPECIAL TESTS:  NT  FUNCTIONAL TESTS:  NT   TODAY'S TREATMENT:                                                                                                                              DATE:   05/19/23 Therapeutic Exercise: to improve strength and mobility.  Demo, verbal and tactile cues throughout for technique. UBE L1 x 3 min f/b  Chest stretch sitting -  no resistance, thumbs up Open book stretch s/Lx 10 each side - L side with bent arm due to pull on chest Cat cows x 10 - difficult on wrists Ball rolls on wall 2 x 1 min each side Manual Therapy: to decrease muscle spasm and pain and improve mobility IASTM with s/s tool to L pects, biceps, forearm supinator, TPR to forearm flexors, STM/TPR to cervical paraspinals.  Modalities: MHP to neck in supine x 10 min (not included in treatment time)   05/15/23 Therapeutic Exercise: to improve strength, ROM, flexibility, and endurance  UBE L1.0 3 min each way Shoulder rolls x 10 Standing B ER RTB X 10  Standing B horizontal ABD RTB X 10  SNAG cervical extension x 10  SNAG to cervical rotation x 10 both wayus Manual Therapy: to decrease muscle spasm, pain and improve mobility.  STM to L UT, LS, rhomboids  05/09/23 EVAL Self Care: Findings, POC, education on PNS, recommendation for gentle ROM of neck and shoulders within pain free ROM, start slow Modalities: MHP to neck in supine x 8 min  PATIENT EDUCATION:  Education details: continue HEP as tolerated Person educated: Patient Education method: Programmer, multimedia, Demonstration, and Verbal cues Education comprehension: verbalized understanding  HOME EXERCISE PROGRAM: Access Code: [email or web address] URL: [email or web address] Date: 05/15/2023 Prepared by: Verta Ellen  Exercises - Shoulder External Rotation and Scapular Retraction with Resistance  - 1 x daily - 3 x weekly - 2 sets - 10 reps - Standing Shoulder Horizontal Abduction with Resistance  - 1 x  daily - 3 x weekly - 2 sets - 10 reps - Cervical Extension AROM with Strap  - 1 x daily - 7 x weekly - 2 sets - 10 reps - Seated Assisted Cervical Rotation with Towel  - 1 x daily - 7 x weekly - 2 sets - 10 reps  ASSESSMENT:  CLINICAL IMPRESSION: Patient is a 53 y.o. right hand dominant male who was seen today for physical therapy treatment for s/p ACDF on 04/18/2023. Reporting continued discomfort in L chest and elbow.  Needed modification with s/l open books on Left side.  Noted trigger points in flexor/elbow supinators on Left, discussed trying TrDN to this area next session.  Overall, continuing to progress with mobility although fatigues quickly.   Scott Meza continues to demonstrate potential for improvement and would benefit from continued skilled therapy to address impairments.         OBJECTIVE IMPAIRMENTS: decreased activity tolerance, decreased endurance, decreased ROM, decreased strength, increased edema, increased fascial restrictions, impaired perceived functional ability, increased muscle spasms, impaired sensation, impaired UE functional use, postural dysfunction, and pain.   ACTIVITY LIMITATIONS: carrying, lifting, bending, sitting, standing, sleeping, transfers, dressing, reach over head, hygiene/grooming, and caring for others  PARTICIPATION LIMITATIONS: meal prep, cleaning, laundry, driving, shopping, community activity, occupation, and yard work  PERSONAL FACTORS: Past/current experiences, Time since onset of injury/illness/exacerbation, and 1-2 comorbidities:  ACDF, Bipolar, PTSD, chronic hep C., R carpal tunnel release,   are also affecting patient's functional outcome.   REHAB POTENTIAL: Good  CLINICAL DECISION MAKING: Evolving/moderate complexity  EVALUATION COMPLEXITY: Moderate   GOALS: Goals reviewed with patient? Yes  SHORT TERM GOALS: Target date: 05/23/2023   Patient will be independent with initial HEP.  Baseline:  Goal status:  INITIAL    LONG TERM GOALS: Target date: 06/20/2023   Patient will be independent with advanced/ongoing HEP to improve outcomes and carryover.  Baseline:  Goal status: INITIAL  2.  Patient will report 75% improvement in neck pain to improve QOL.  Baseline: 7/10 Goal status: INITIAL  3.  Patient will demonstrate full pain free cervical ROM for safety with driving.  Baseline: see objective Goal status: INITIAL  4.  Patient will report at least 8 points improvement on NDI to demonstrate improved functional ability.  Baseline: 37/50 Goal status: INITIAL  5.  Patient will demonstrate 75% improvement in radicular symptoms.  Baseline: still getting numbness/tingling down both hands especially in morning Goal status: INITIAL  6. Patient will demonstrate improved grip strength to 75lbs bil. To perform job duties safely.  Baseline: 60lbs R, 55lbs L Goal status: INITIAL      PLAN:  PT FREQUENCY: 2x/week  PT DURATION: 6 weeks  PLANNED INTERVENTIONS: 97110-Therapeutic exercises, 97530- Therapeutic activity, [id number]- Neuromuscular re-education, 97535- Self Care, 16109- Manual therapy, [id number]- Gait training, [phone]- Ultrasound, Joint mobilization, Joint manipulation, Cryotherapy, and Moist heat  PLAN FOR NEXT SESSION: gentle cervical ROM, postural/core strengthening, grip strengthening, manual therapy, modalities PRN.     Jena Gauss, PT 05/19/2023, 8:03 AM

## 2023-05-23 ENCOUNTER — Encounter: Payer: Self-pay | Admitting: Physical Therapy

## 2023-05-23 ENCOUNTER — Ambulatory Visit: Admitting: Physical Therapy

## 2023-05-23 DIAGNOSIS — M5412 Radiculopathy, cervical region: Secondary | ICD-10-CM | POA: Diagnosis not present

## 2023-05-23 DIAGNOSIS — R252 Cramp and spasm: Secondary | ICD-10-CM

## 2023-05-23 DIAGNOSIS — M542 Cervicalgia: Secondary | ICD-10-CM

## 2023-05-23 DIAGNOSIS — M6281 Muscle weakness (generalized): Secondary | ICD-10-CM

## 2023-05-23 NOTE — Therapy (Signed)
 OUTPATIENT PHYSICAL THERAPY CERVICAL TREATMENT   Patient Name: Scott Meza MRN: [id number] DOB:[birth year], 53 y.o., male Today's Date: 05/23/2023  END OF SESSION:  PT End of Session - 05/23/23 0854     Visit Number 4    Authorization Type [employer]    Authorization Time Period pending    PT Start Time [phone]    Activity Tolerance Patient tolerated treatment well    Behavior During Therapy [facility] for tasks assessed/performed              Past Medical History:  Diagnosis Date   Anxiety    Cervical radiculopathy    Past Surgical History:  Procedure Laterality Date   ANTERIOR CERVICAL DECOMP/DISCECTOMY FUSION  04/18/2023   C5-7   CARPAL TUNNEL RELEASE Right    ELBOW ARTHROSCOPY Right    HEMORROIDECTOMY     Patient Active Problem List   Diagnosis Date Noted   Nocturia 01/17/2023   Chronic hepatitis C without hepatic coma (HCC) 01/17/2023   Cervical radiculopathy 04/06/2021   OA (osteoarthritis) of knee 08/07/2020   Bipolar 2 disorder, major depressive episode (HCC) 07/20/2020   GAD (generalized anxiety disorder) 07/20/2020   PTSD (post-traumatic stress disorder) 07/20/2020    PCP: Alfredia Ferguson, PA-C   REFERRING PROVIDER: Alfredia Ferguson, PA-C  REFERRING DIAG: [phone] cervical radiculopathy M50.3 Degeneration of cervical intervertebral disk  THERAPY DIAG:  Radiculopathy, cervical region  Cramp and spasm  Muscle weakness (generalized)  Cervicalgia  Rationale for Evaluation and Treatment: Rehabilitation  ONSET DATE: 04/18/23  SUBJECTIVE:                                                                                                                                                                                                         SUBJECTIVE STATEMENT: Doing well today.  Everyday a little better unless I do something stupid.   Hand dominance: Right  PERTINENT HISTORY:  ACDF L5-7 on 04/18/23; PMH: Bipolar, PTSD, chronic hep C., R  carpal tunnel release, R elbow arthroscopy   PAIN:  Are you having pain? Yes: NPRS scale: 0/10 Pain location: L upper shoulder/neck Pain description: pulling Aggravating factors: moving neck Relieving factors: keeping neck still, medication, heat  PRECAUTIONS: Cervical - lifting < 15 lbs for 6 weeks, limit overhead activities.   RED FLAGS: None     WEIGHT BEARING RESTRICTIONS: No  FALLS:  Has patient fallen in last 6 months? No  LIVING ENVIRONMENT: Lives with: lives with their family Lives in: House/apartment Stairs: No Has following equipment at home: None  OCCUPATION: Holiday representative  PLOF:  Independent  PATIENT GOALS: decrease pain, be able to work  NEXT MD VISIT: 6 weeks  OBJECTIVE:   DIAGNOSTIC FINDINGS:  05/05/2023 XR spine cervical  IMPRESSION:   1.  No acute radiographic findings.  2.  C5-C6 and C6-C7 ACDF.   PATIENT SURVEYS:  NDI 37/50  COGNITION: Overall cognitive status: Within functional limits for tasks assessed  SENSATION: Light touch: Impaired  in fingers  POSTURE:  decreased cervical lordosis  PALPATION: Tenderness/spasm throughout cervical paraspinals, UT, levator scapulae bilaterally, L>R   CERVICAL ROM:   Active ROM A/PROM (deg) eval  Flexion 15p!  Extension 15p!  Right lateral flexion   Left lateral flexion   Right rotation 33p!  Left rotation 35p!   (Blank rows = not tested)  UPPER EXTREMITY ROM:  Active ROM Right eval Left eval  Shoulder flexion 135 130  Shoulder abduction 100 85   (Blank rows = not tested)  UPPER EXTREMITY MMT:  MMT Right eval Left eval  Shoulder flexion 5 5  Shoulder extension    Shoulder abduction 5 5  Shoulder internal rotation 5 5  Shoulder external rotation 5 5  Elbow flexion 5 5  Elbow extension 5 5  Wrist flexion 5 5  Wrist extension 5 5  Grip strength 60lbs 55lbs   (Blank rows = not tested)  CERVICAL SPECIAL TESTS:  NT  FUNCTIONAL TESTS:  NT   TODAY'S TREATMENT:                                                                                                                               DATE:   05/23/23 Therapeutic Exercise: to improve strength and mobility.  Demo, verbal and tactile cues throughout for technique. UBE x 4 min f/b Wall angels x 10 - very challenging Wall push-ups x 10 Ball rolls on wall 2 x 30 sec each side Cat cows x 10 - using push-up handles - less pressure on wrists, tactile cues needed Manual Therapy: to decrease muscle spasm and pain and improve mobility STM/TPR and IASTM to L forearm flexors/supinators and brachioradialis mm., mobs to proximal radius, skilled palpation and monitoring during dry needling. Trigger Point Dry Needling  Subsequent Treatment: Instructions provided previously at initial dry needling treatment.  Instructions reviewed, if requested by the patient, prior to subsequent dry needling treatment.   Patient Verbal Consent Given: Yes Education Handout Provided: Previously Provided Muscles Treated: L forearm flexors/supinators, brachioradialis mm. Electrical Stimulation Performed: No Treatment Response/Outcome: Twitch Response Elicited and Palpable Increase in Muscle Length    05/19/23 Therapeutic Exercise: to improve strength and mobility.  Demo, verbal and tactile cues throughout for technique. UBE L1 x 3 min f/b  Chest stretch sitting -  no resistance, thumbs up Open book stretch s/Lx 10 each side - L side with bent arm due to pull on chest Cat cows x 10 - difficult on wrists Ball rolls on wall 2 x 1 min each side Manual Therapy: to decrease  muscle spasm and pain and improve mobility IASTM with s/s tool to L pects, biceps, forearm supinator, TPR to forearm flexors, STM/TPR to cervical paraspinals.  Modalities: MHP to neck in supine x 10 min (not included in treatment time)   05/15/23 Therapeutic Exercise: to improve strength, ROM, flexibility, and endurance  UBE L1.0 3 min each way Shoulder rolls x 10 Standing B  ER RTB X 10  Standing B horizontal ABD RTB X 10  SNAG cervical extension x 10  SNAG to cervical rotation x 10 both wayus Manual Therapy: to decrease muscle spasm, pain and improve mobility.  STM to L UT, LS, rhomboids  05/09/23 EVAL Self Care: Findings, POC, education on PNS, recommendation for gentle ROM of neck and shoulders within pain free ROM, start slow Modalities: MHP to neck in supine x 8 min     PATIENT EDUCATION:  Education details: continue HEP as tolerated Person educated: Patient Education method: Programmer, multimedia, Demonstration, and Verbal cues Education comprehension: verbalized understanding  HOME EXERCISE PROGRAM: Access Code: [email or web address] URL: [email or web address] Date: 05/15/2023 Prepared by: Verta Ellen  Exercises - Shoulder External Rotation and Scapular Retraction with Resistance  - 1 x daily - 3 x weekly - 2 sets - 10 reps - Standing Shoulder Horizontal Abduction with Resistance  - 1 x daily - 3 x weekly - 2 sets - 10 reps - Cervical Extension AROM with Strap  - 1 x daily - 7 x weekly - 2 sets - 10 reps - Seated Assisted Cervical Rotation with Towel  - 1 x daily - 7 x weekly - 2 sets - 10 reps  ASSESSMENT:  CLINICAL IMPRESSION: Patient is a 54 y.o. right hand dominant male who was seen today for physical therapy treatment for s/p ACDF on 04/18/2023. Reporting continued discomfort in L  elbow.  Still extremely tight in chest, challenged today with wall angels, and even tight in low back with cat cows.  After manual therapy including TrDN to L elbow, reported that his L hand felt much better and had less stiffness.     Rudi Bunyard Vessey continues to demonstrate potential for improvement and would benefit from continued skilled therapy to address impairments.         OBJECTIVE IMPAIRMENTS: decreased activity tolerance, decreased endurance, decreased ROM, decreased strength, increased edema, increased fascial restrictions, impaired perceived  functional ability, increased muscle spasms, impaired sensation, impaired UE functional use, postural dysfunction, and pain.   ACTIVITY LIMITATIONS: carrying, lifting, bending, sitting, standing, sleeping, transfers, dressing, reach over head, hygiene/grooming, and caring for others  PARTICIPATION LIMITATIONS: meal prep, cleaning, laundry, driving, shopping, community activity, occupation, and yard work  PERSONAL FACTORS: Past/current experiences, Time since onset of injury/illness/exacerbation, and 1-2 comorbidities:  ACDF, Bipolar, PTSD, chronic hep C., R carpal tunnel release,   are also affecting patient's functional outcome.   REHAB POTENTIAL: Good  CLINICAL DECISION MAKING: Evolving/moderate complexity  EVALUATION COMPLEXITY: Moderate   GOALS: Goals reviewed with patient? Yes  SHORT TERM GOALS: Target date: 05/23/2023   Patient will be independent with initial HEP.  Baseline:  Goal status: MET 05/23/23    LONG TERM GOALS: Target date: 06/20/2023   Patient will be independent with advanced/ongoing HEP to improve outcomes and carryover.  Baseline:  Goal status: IN PROGRESS  2.  Patient will report 75% improvement in neck pain to improve QOL.  Baseline: 7/10 Goal status: IN PROGRESS  3.  Patient will demonstrate full pain free cervical ROM for safety with driving.  Baseline: see objective Goal  status: IN PROGRESS  4.  Patient will report at least 8 points improvement on NDI to demonstrate improved functional ability.  Baseline: 37/50 Goal status: IN PROGRESS  5.  Patient will demonstrate 75% improvement in radicular symptoms.  Baseline: still getting numbness/tingling down both hands especially in morning Goal status: IN PROGRESS  6. Patient will demonstrate improved grip strength to 75lbs bil. To perform job duties safely.  Baseline: 60lbs R, 55lbs L Goal status: IN PROGRESS      PLAN:  PT FREQUENCY: 2x/week  PT DURATION: 6 weeks  PLANNED INTERVENTIONS:  97110-Therapeutic exercises, 97530- Therapeutic activity, [id number]- Neuromuscular re-education, 97535- Self Care, 16109- Manual therapy, [id number]- Gait training, [phone]- Ultrasound, Joint mobilization, Joint manipulation, Cryotherapy, and Moist heat  PLAN FOR NEXT SESSION: gentle cervical ROM, postural/core strengthening, grip strengthening, manual therapy, modalities PRN.     Jena Gauss, PT, DPT 05/23/2023, 8:55 AM

## 2023-05-25 ENCOUNTER — Encounter: Payer: Self-pay | Admitting: Physician Assistant

## 2023-05-25 ENCOUNTER — Encounter: Payer: Self-pay | Admitting: Physical Therapy

## 2023-05-25 ENCOUNTER — Ambulatory Visit (INDEPENDENT_AMBULATORY_CARE_PROVIDER_SITE_OTHER): Admitting: Physician Assistant

## 2023-05-25 ENCOUNTER — Ambulatory Visit: Admitting: Physical Therapy

## 2023-05-25 VITALS — BP 146/86 | HR 82 | Temp 97.8°F | Resp 16 | Ht 72.0 in | Wt 251.5 lb

## 2023-05-25 DIAGNOSIS — I10 Essential (primary) hypertension: Secondary | ICD-10-CM | POA: Diagnosis not present

## 2023-05-25 DIAGNOSIS — R252 Cramp and spasm: Secondary | ICD-10-CM

## 2023-05-25 DIAGNOSIS — Z0000 Encounter for general adult medical examination without abnormal findings: Secondary | ICD-10-CM | POA: Diagnosis not present

## 2023-05-25 DIAGNOSIS — Z13220 Encounter for screening for lipoid disorders: Secondary | ICD-10-CM | POA: Diagnosis not present

## 2023-05-25 DIAGNOSIS — B182 Chronic viral hepatitis C: Secondary | ICD-10-CM

## 2023-05-25 DIAGNOSIS — R739 Hyperglycemia, unspecified: Secondary | ICD-10-CM | POA: Diagnosis not present

## 2023-05-25 DIAGNOSIS — M6281 Muscle weakness (generalized): Secondary | ICD-10-CM

## 2023-05-25 DIAGNOSIS — M5412 Radiculopathy, cervical region: Secondary | ICD-10-CM | POA: Diagnosis not present

## 2023-05-25 DIAGNOSIS — M542 Cervicalgia: Secondary | ICD-10-CM

## 2023-05-25 LAB — LDL CHOLESTEROL, DIRECT: Direct LDL: 123 mg/dL

## 2023-05-25 LAB — LIPID PANEL
Cholesterol: 205 mg/dL — ABNORMAL HIGH (ref 0–200)
HDL: 39.9 mg/dL (ref >39.00)
NonHDL: 165.19
Total CHOL/HDL Ratio: 5
Triglycerides: 447 mg/dL — ABNORMAL HIGH (ref 0.0–149.0)
VLDL: 89.4 mg/dL — ABNORMAL HIGH (ref 0.0–40.0)

## 2023-05-25 LAB — COMPREHENSIVE METABOLIC PANEL WITH GFR
ALT: 69 U/L — ABNORMAL HIGH (ref 0–53)
AST: 33 U/L (ref 0–37)
Albumin: 4.7 g/dL (ref 3.5–5.2)
Alkaline Phosphatase: 81 U/L (ref 39–117)
BUN: 14 mg/dL (ref 6–23)
CO2: 26 meq/L (ref 19–32)
Calcium: 9.4 mg/dL (ref 8.4–10.5)
Chloride: 102 meq/L (ref 96–112)
Creatinine, Ser: 0.79 mg/dL (ref 0.40–1.50)
GFR: 102.52 mL/min (ref >60.00)
Glucose, Bld: 105 mg/dL — ABNORMAL HIGH (ref 70–99)
Potassium: 4.3 meq/L (ref 3.5–5.1)
Sodium: 138 meq/L (ref 135–145)
Total Bilirubin: 0.6 mg/dL (ref 0.2–1.2)
Total Protein: 7.1 g/dL (ref 6.0–8.3)

## 2023-05-25 MED ORDER — LISINOPRIL 10 MG PO TABS: 10.0000 mg | ORAL_TABLET | Freq: Every day | ORAL | 1 refills | Status: AC

## 2023-05-25 NOTE — Therapy (Signed)
 OUTPATIENT PHYSICAL THERAPY CERVICAL TREATMENT   Patient Name: Scott Meza MRN: [id number] DOB:Apr 03, 1970, 53 y.o., male Today's Date: 05/25/2023  END OF SESSION:  PT End of Session - 05/25/23 0852     Visit Number 5    Authorization Type [employer] [facility] Medicaid    Authorization Time Period 12 visits 3/17-6/14    Authorization - Visit Number 4    Authorization - Number of Visits 12    PT Start Time 0852    PT Stop Time 0934    PT Time Calculation (min) 42 min    Activity Tolerance Patient tolerated treatment well    Behavior During Therapy [facility] for tasks assessed/performed              Past Medical History:  Diagnosis Date   Anxiety    Cervical radiculopathy    Past Surgical History:  Procedure Laterality Date   ANTERIOR CERVICAL DECOMP/DISCECTOMY FUSION  04/18/2023   C5-7   CARPAL TUNNEL RELEASE Right    ELBOW ARTHROSCOPY Right    HEMORROIDECTOMY     Patient Active Problem List   Diagnosis Date Noted   Primary hypertension 05/25/2023   Nocturia 01/17/2023   Chronic hepatitis C without hepatic coma (HCC) 01/17/2023   Cervical radiculopathy 04/06/2021   OA (osteoarthritis) of knee 08/07/2020   Bipolar 2 disorder, major depressive episode (HCC) 07/20/2020   GAD (generalized anxiety disorder) 07/20/2020   PTSD (post-traumatic stress disorder) 07/20/2020    PCP: Alfredia Ferguson, PA-C   REFERRING PROVIDER: Alfredia Ferguson, PA-C  REFERRING DIAG: [phone] cervical radiculopathy M50.3 Degeneration of cervical intervertebral disk  THERAPY DIAG:  Radiculopathy, cervical region  Cramp and spasm  Muscle weakness (generalized)  Cervicalgia  Rationale for Evaluation and Treatment: Rehabilitation  ONSET DATE: 04/18/23  SUBJECTIVE:                                                                                                                                                                                                         SUBJECTIVE  STATEMENT: Neck and shoulders are super stiff today.  Haven't been taking any pain meds.    Hand dominance: Right  PERTINENT HISTORY:  ACDF L5-7 on 04/18/23; PMH: Bipolar, PTSD, chronic hep C., R carpal tunnel release, R elbow arthroscopy   PAIN:  Are you having pain? Yes: NPRS scale: 4/10 Pain location: L upper shoulder/neck Pain description: pulling Aggravating factors: moving neck Relieving factors: keeping neck still, medication, heat  PRECAUTIONS: Cervical - lifting < 15 lbs for 6 weeks, limit overhead activities.   RED FLAGS: None  WEIGHT BEARING RESTRICTIONS: No  FALLS:  Has patient fallen in last 6 months? No  LIVING ENVIRONMENT: Lives with: lives with their family Lives in: House/apartment Stairs: No Has following equipment at home: None  OCCUPATION: Holiday representative  PLOF: Independent  PATIENT GOALS: decrease pain, be able to work  NEXT MD VISIT: 6 weeks  OBJECTIVE:   DIAGNOSTIC FINDINGS:  05/05/2023 XR spine cervical  IMPRESSION:   1.  No acute radiographic findings.  2.  C5-C6 and C6-C7 ACDF.   PATIENT SURVEYS:  NDI 37/50  COGNITION: Overall cognitive status: Within functional limits for tasks assessed  SENSATION: Light touch: Impaired  in fingers  POSTURE:  decreased cervical lordosis  PALPATION: Tenderness/spasm throughout cervical paraspinals, UT, levator scapulae bilaterally, L>R   CERVICAL ROM:   Active ROM A/PROM (deg) eval  Flexion 15p!  Extension 15p!  Right lateral flexion   Left lateral flexion   Right rotation 33p!  Left rotation 35p!   (Blank rows = not tested)  UPPER EXTREMITY ROM:  Active ROM Right eval Left eval  Shoulder flexion 135 130  Shoulder abduction 100 85   (Blank rows = not tested)  UPPER EXTREMITY MMT:  MMT Right eval Left eval  Shoulder flexion 5 5  Shoulder extension    Shoulder abduction 5 5  Shoulder internal rotation 5 5  Shoulder external rotation 5 5  Elbow flexion 5 5  Elbow  extension 5 5  Wrist flexion 5 5  Wrist extension 5 5  Grip strength 60lbs 55lbs   (Blank rows = not tested)  CERVICAL SPECIAL TESTS:  NT  FUNCTIONAL TESTS:  NT   TODAY'S TREATMENT:                                                                                                                              DATE:   05/25/23 Therapeutic Exercise: to improve strength and mobility.  Demo, verbal and tactile cues throughout for technique. UBE x 4 min f/b Cat cows - modified on counter Standing external rotation stretch Shoulder rolls forward back Thoracic mobilization foam roller against wall Neck rotation with ball against wall  Wall angels - standing - tight and painful today- switched to supine partial ROM Shoulder flexion in supine Manual Therapy: to decrease muscle spasm and pain and improve mobility STM/TPR to cervical paraspinals, IASTM with s/s tools to L forearm - flexors and extensor group Modalities: MHP to neck ~ 6 min - concurrent with manual therapy to L forearm.     05/23/23 Therapeutic Exercise: to improve strength and mobility.  Demo, verbal and tactile cues throughout for technique. UBE x 4 min f/b Wall angels x 10 - very challenging Wall push-ups x 10 Ball rolls on wall 2 x 30 sec each side Cat cows x 10 - using push-up handles - less pressure on wrists, tactile cues needed Manual Therapy: to decrease muscle spasm and pain and improve mobility STM/TPR and IASTM to L forearm flexors/supinators  and brachioradialis mm., mobs to proximal radius, skilled palpation and monitoring during dry needling. Trigger Point Dry Needling  Subsequent Treatment: Instructions provided previously at initial dry needling treatment.  Instructions reviewed, if requested by the patient, prior to subsequent dry needling treatment.   Patient Verbal Consent Given: Yes Education Handout Provided: Previously Provided Muscles Treated: L forearm flexors/supinators, brachioradialis  mm. Electrical Stimulation Performed: No Treatment Response/Outcome: Twitch Response Elicited and Palpable Increase in Muscle Length    05/19/23 Therapeutic Exercise: to improve strength and mobility.  Demo, verbal and tactile cues throughout for technique. UBE L1 x 3 min f/b  Chest stretch sitting -  no resistance, thumbs up Open book stretch s/Lx 10 each side - L side with bent arm due to pull on chest Cat cows x 10 - difficult on wrists Ball rolls on wall 2 x 1 min each side Manual Therapy: to decrease muscle spasm and pain and improve mobility IASTM with s/s tool to L pects, biceps, forearm supinator, TPR to forearm flexors, STM/TPR to cervical paraspinals.  Modalities: MHP to neck in supine x 10 min (not included in treatment time)     PATIENT EDUCATION:  Education details: Handout on Tennis elbow exercises Person educated: Patient Education method: Explanation, Demonstration, and Verbal cues Education comprehension: verbalized understanding  HOME EXERCISE PROGRAM: [email or web address]  Access Code: [email or web address] URL: [email or web address] Date: 05/15/2023 Prepared by: Verta Ellen  Exercises - Shoulder External Rotation and Scapular Retraction with Resistance  - 1 x daily - 3 x weekly - 2 sets - 10 reps - Standing Shoulder Horizontal Abduction with Resistance  - 1 x daily - 3 x weekly - 2 sets - 10 reps - Cervical Extension AROM with Strap  - 1 x daily - 7 x weekly - 2 sets - 10 reps - Seated Assisted Cervical Rotation with Towel  - 1 x daily - 7 x weekly - 2 sets - 10 reps  ASSESSMENT:  CLINICAL IMPRESSION: Patient is a 53 y.o. right hand dominant male who was seen today for physical therapy treatment for s/p ACDF on 04/18/2023. Very stiff and painful today in neck and shoulders, still having a lot of elbow pain in lateral epicondlye.  Focused on gentle AROM exercises to  tolerance followed by manual therapy and MHP, given handout of exercises for lateral epicondylitis as well.  Reported decreased tightness following interventions.     Scott Meza continues to demonstrate potential for improvement and would benefit from continued skilled therapy to address impairments.         OBJECTIVE IMPAIRMENTS: decreased activity tolerance, decreased endurance, decreased ROM, decreased strength, increased edema, increased fascial restrictions, impaired perceived functional ability, increased muscle spasms, impaired sensation, impaired UE functional use, postural dysfunction, and pain.   ACTIVITY LIMITATIONS: carrying, lifting, bending, sitting, standing, sleeping, transfers, dressing, reach over head, hygiene/grooming, and caring for others  PARTICIPATION LIMITATIONS: meal prep, cleaning, laundry, driving, shopping, community activity, occupation, and yard work  PERSONAL FACTORS: Past/current experiences, Time since onset of injury/illness/exacerbation, and 1-2 comorbidities:  ACDF, Bipolar, PTSD, chronic hep C., R carpal tunnel release,   are also affecting patient's functional outcome.   REHAB POTENTIAL: Good  CLINICAL DECISION MAKING: Evolving/moderate complexity  EVALUATION COMPLEXITY: Moderate   GOALS: Goals reviewed with patient? Yes  SHORT TERM GOALS: Target date: 05/23/2023   Patient will be independent with initial HEP.  Baseline:  Goal status: MET 05/23/23    LONG TERM GOALS: Target date: 06/20/2023   Patient will be independent with advanced/ongoing  HEP to improve outcomes and carryover.  Baseline:  Goal status: IN PROGRESS  2.  Patient will report 75% improvement in neck pain to improve QOL.  Baseline: 7/10 Goal status: IN PROGRESS  3.  Patient will demonstrate full pain free cervical ROM for safety with driving.  Baseline: see objective Goal status: IN PROGRESS  4.  Patient will report at least 8 points improvement on NDI to  demonstrate improved functional ability.  Baseline: 37/50 Goal status: IN PROGRESS  5.  Patient will demonstrate 75% improvement in radicular symptoms.  Baseline: still getting numbness/tingling down both hands especially in morning Goal status: IN PROGRESS  6. Patient will demonstrate improved grip strength to 75lbs bil. To perform job duties safely.  Baseline: 60lbs R, 55lbs L Goal status: IN PROGRESS      PLAN:  PT FREQUENCY: 2x/week  PT DURATION: 6 weeks  PLANNED INTERVENTIONS: 97110-Therapeutic exercises, 97530- Therapeutic activity, [id number]- Neuromuscular re-education, 97535- Self Care, [id number]- Manual therapy, [id number]- Gait training, [phone]- Ultrasound, Joint mobilization, Joint manipulation, Cryotherapy, and Moist heat  PLAN FOR NEXT SESSION: gentle cervical ROM, postural/core strengthening, grip strengthening, manual therapy, modalities PRN.     Jena Gauss, PT, DPT 05/25/2023, 11:24 AM

## 2023-05-25 NOTE — Patient Instructions (Signed)
 [place]: [phone] option 1

## 2023-05-25 NOTE — Assessment & Plan Note (Signed)
 Pressure has been consistently elevated for months. Advised we should start antihypertensive meds. Pt is hesitant but agreeable to try Rx lisinopril 10 mg daily F/b 4-6 weeks

## 2023-05-25 NOTE — Progress Notes (Signed)
 Complete physical exam   Patient: Scott Meza   DOB: 04/21/1970   53 y.o. Male  MRN: [id number] Visit Date: 05/25/2023  Today's healthcare provider: Alfredia Ferguson, PA-C   Cc. cpe  Subjective    Scott Meza is a 53 y.o. male who presents today for a complete physical exam.   He is s/p Cervical discectomy w/ fusion 04/18/23  Pt was seen in ED for left sided chest pain 3/15 but left before orders were discussed and was not reevaluated. Pt reports this was radiating pain from his neck.  Denies any acute concerns today. Past Medical History:  Diagnosis Date   Anxiety    Cervical radiculopathy    Past Surgical History:  Procedure Laterality Date   ANTERIOR CERVICAL DECOMP/DISCECTOMY FUSION  04/18/2023   C5-7   CARPAL TUNNEL RELEASE Right    ELBOW ARTHROSCOPY Right    HEMORROIDECTOMY     Social History   Socioeconomic History   Marital status: Single    Spouse name: Not on file   Number of children: Not on file   Years of education: Not on file   Highest education level: Not on file  Occupational History   Not on file  Tobacco Use   Smoking status: Former   Smokeless tobacco: Former    Types: Chew  Substance and Sexual Activity   Alcohol use: Not Currently    Comment: 2 weeks ago drank twice    Drug use: Not Currently    Types: Marijuana    Comment: smoked a marijuna joint last week.    Sexual activity: Yes    Partners: Female  Other Topics Concern   Not on file  Social History Narrative   Not on file   Social Drivers of Health   Financial Resource Strain: High Risk (09/07/2021)   Received from [facility], [facility]   Overall Financial Resource Strain (CARDIA)    Difficulty of Paying Living Expenses: Very hard  Food Insecurity: Food Insecurity Present (09/07/2021)   Received from [facility], [facility]   Hunger Vital Sign    Worried About Running Out of Food in the Last Year: Often true    Ran Out of Food in the Last  Year: Often true  Transportation Needs: No Transportation Needs (09/07/2021)   Received from [employer], [facility]   PRAPARE - Transportation    Lack of Transportation (Medical): No    Lack of Transportation (Non-Medical): No  Physical Activity: Sufficiently Active (09/07/2021)   Received from [facility], [facility]   Exercise Vital Sign    Days of Exercise per Week: 2 days    Minutes of Exercise per Session: 80 min  Stress: No Stress Concern Present (04/18/2023)   Received [facility] - Occupational Stress Questionnaire    Feeling of Stress : Not at all  Social Connections: Unknown (09/12/2022)   Received from [facility]   Social Network    Social Network: Not on file  Intimate Partner Violence: Not At Risk (04/18/2023)   Received from [facility]   HITS    Over the last 12 months how often did your partner physically hurt you?: Never    Over the last 12 months how often did your partner insult you or talk down to you?: Never    Over the last 12 months how often did your partner threaten you with physical harm?: Never    Over the last 12  months how often did your partner scream or curse at you?: Never   No family status information on file.   Family History  Adopted: Yes   No Known Allergies  Patient Care Team: Alfredia Ferguson, PA-C as PCP - General (Physician Assistant)   Medications: Outpatient Medications Prior to Visit  Medication Sig   ibuprofen (ADVIL) 200 MG tablet Take 200 mg by mouth 2 (two) times daily.   [DISCONTINUED] gabapentin (NEURONTIN) 300 MG capsule Take 1 capsule (300 mg total) by mouth 3 (three) times daily. (Patient not taking: Reported on 05/25/2023)   [DISCONTINUED] methocarbamol (ROBAXIN) 750 MG tablet Take 750 mg by mouth 4 (four) times daily. (Patient not taking: Reported on 05/25/2023)   [DISCONTINUED] oxyCODONE (OXY IR/ROXICODONE) 5 MG immediate release tablet Take 1 tablet (5 mg total)  by mouth every 12 (twelve) hours as needed for severe pain (pain score 7-10). This rx needs to last until surgery , no further refills from this office (Patient not taking: Reported on 05/25/2023)   No facility-administered medications prior to visit.    Review of Systems  Constitutional:  Negative for fatigue and fever.  Respiratory:  Negative for cough and shortness of breath.   Cardiovascular:  Negative for chest pain, palpitations and leg swelling.  Genitourinary:  Positive for frequency.  Musculoskeletal:  Positive for neck pain.  Neurological:  Negative for dizziness and headaches.      Objective    BP (!) 146/86   Pulse 82   Temp 97.8 F (36.6 C) (Oral)   Resp 16   Ht 6' (1.829 m)   Wt 251 lb 8 oz (114.1 kg)   SpO2 96%   BMI 34.11 kg/m    Physical Exam Constitutional:      General: He is awake.     Appearance: He is well-developed.  HENT:     Head: Normocephalic.     Right Ear: Tympanic membrane, ear canal and external ear normal.     Left Ear: Tympanic membrane, ear canal and external ear normal.     Nose: Nose normal. No congestion or rhinorrhea.     Mouth/Throat:     Mouth: Mucous membranes are moist.     Pharynx: No oropharyngeal exudate or posterior oropharyngeal erythema.  Eyes:     Pupils: Pupils are equal, round, and reactive to light.  Cardiovascular:     Rate and Rhythm: Normal rate and regular rhythm.     Heart sounds: Normal heart sounds.  Pulmonary:     Effort: Pulmonary effort is normal.     Breath sounds: Normal breath sounds.  Abdominal:     General: There is no distension.     Palpations: Abdomen is soft.     Tenderness: There is no abdominal tenderness. There is no guarding.  Musculoskeletal:     Cervical back: Normal range of motion.  Lymphadenopathy:     Cervical: No cervical adenopathy.  Skin:    General: Skin is warm.  Neurological:     Mental Status: He is alert and oriented to person, place, and time.  Psychiatric:         Attention and Perception: Attention normal.        Mood and Affect: Mood normal.        Speech: Speech normal.        Behavior: Behavior normal. Behavior is cooperative.     Last depression screening scores    05/25/2023    9:48 AM 03/28/2023    1:35 PM 01/24/2022  11:37 AM  PHQ 2/9 Scores  PHQ - 2 Score 0 0 1  PHQ- 9 Score  7 10   Last fall risk screening    05/25/2023    9:47 AM  Fall Risk   Falls in the past year? 0  Number falls in past yr: 0  Injury with Fall? 0  Follow up Falls evaluation completed;Education provided   Last Audit-C alcohol use screening    07/20/2020   11:23 AM  Alcohol Use Disorder Test (AUDIT)  1. How often do you have a drink containing alcohol?   2. How many drinks containing alcohol do you have on a typical day when you are drinking?   3. How often do you have six or more drinks on one occasion?   AUDIT-C Score      Information is confidential and restricted. Go to Review Flowsheets to unlock data.   A score of 3 or more in women, and 4 or more in men indicates increased risk for alcohol abuse, EXCEPT if all of the points are from question 1   No results found for any visits on 05/25/23.  Assessment & Plan    Routine Health Maintenance and Physical Exam  Exercise Activities and Dietary recommendations --balanced diet high in fiber and protein, low in sugars, carbs, fats. --physical activity/exercise 20-30 minutes 3-5 times a week    Immunization History  Administered Date(s) Administered   Tdap 03/26/2015    Health Maintenance  Topic Date Due   Colonoscopy  Never done   Zoster Vaccines- Shingrix (1 of 2) Never done   COVID-19 Vaccine (1 - 2024-25 season) Never done   INFLUENZA VACCINE  05/29/2023 (Originally 09/29/2022)   DTaP/Tdap/Td (2 - Td or Tdap) 03/25/2025   Hepatitis C Screening  Completed   HIV Screening  Completed   HPV VACCINES  Aged Out    Discussed health benefits of physical activity, and encouraged him to engage in  regular exercise appropriate for his age and condition.  Problem List Items Addressed This Visit       Cardiovascular and Mediastinum   Primary hypertension   Pressure has been consistently elevated for months. Advised we should start antihypertensive meds. Pt is hesitant but agreeable to try Rx lisinopril 10 mg daily F/b 4-6 weeks      Relevant Medications   lisinopril (ZESTRIL) 10 MG tablet     Digestive   Chronic hepatitis C without hepatic coma (HCC)   Pt still has not f/b with infectious disease. Again referred.      Relevant Orders   Hepatitis C antibody   Ambulatory referral to Infectious Disease   Other Visit Diagnoses       Annual physical exam    -  Primary   Relevant Orders   Comp Met (CMET)   Lipid panel   HgB A1c     Hyperglycemia       Relevant Orders   HgB A1c      General Health Maintenance Due for a colonoscopy and has not yet scheduled the appointment. Due for a shingles vaccine but expressed reluctance due to concerns about side effects.  - Provide information and contact details for scheduling a colonoscopy - Discuss shingles vaccine and potential side effects         Return in about 4 weeks (around 06/22/2023) for hypertension.     Alfredia Ferguson, PA-C  [facility] [phone] (phone) [phone] (fax)  [facility]

## 2023-05-25 NOTE — Assessment & Plan Note (Signed)
 Pt still has not f/b with infectious disease. Again referred.

## 2023-05-27 LAB — HEMOGLOBIN A1C: Hgb A1c MFr Bld: 5.9 % (ref 4.6–6.5)

## 2023-05-29 ENCOUNTER — Encounter: Payer: Self-pay | Admitting: Physician Assistant

## 2023-05-29 LAB — HCV RNA,QUANTITATIVE REAL TIME PCR
HCV Quantitative Log: 6.05 {Log_IU}/mL — ABNORMAL HIGH
HCV RNA, PCR, QN: 1120000 [IU]/mL — ABNORMAL HIGH

## 2023-05-29 LAB — HEPATITIS C ANTIBODY: Hepatitis C Ab: REACTIVE — AB

## 2023-05-30 ENCOUNTER — Other Ambulatory Visit: Payer: Self-pay

## 2023-05-30 ENCOUNTER — Ambulatory Visit: Attending: Physician Assistant

## 2023-05-30 DIAGNOSIS — M5412 Radiculopathy, cervical region: Secondary | ICD-10-CM | POA: Diagnosis present

## 2023-05-30 DIAGNOSIS — R252 Cramp and spasm: Secondary | ICD-10-CM | POA: Diagnosis present

## 2023-05-30 DIAGNOSIS — M542 Cervicalgia: Secondary | ICD-10-CM | POA: Insufficient documentation

## 2023-05-30 DIAGNOSIS — M6281 Muscle weakness (generalized): Secondary | ICD-10-CM | POA: Insufficient documentation

## 2023-05-30 NOTE — Therapy (Signed)
 OUTPATIENT PHYSICAL THERAPY CERVICAL TREATMENT   Patient Name: Scott Meza MRN: [id number] DOB:1970-08-26, 53 y.o., male Today's Date: 05/30/2023  END OF SESSION:  PT End of Session - 05/30/23 1053     Visit Number 6    Authorization Type [employer]    Authorization Time Period 12 visits 3/17-6/14    Authorization - Number of Visits 12    PT Start Time 0847    PT Stop Time 0934    PT Time Calculation (min) 47 min    Activity Tolerance Patient limited by pain    Behavior During Therapy [facility] for tasks assessed/performed               Past Medical History:  Diagnosis Date   Anxiety    Cervical radiculopathy    Past Surgical History:  Procedure Laterality Date   ANTERIOR CERVICAL DECOMP/DISCECTOMY FUSION  04/18/2023   C5-7   CARPAL TUNNEL RELEASE Right    ELBOW ARTHROSCOPY Right    HEMORROIDECTOMY     Patient Active Problem List   Diagnosis Date Noted   Primary hypertension 05/25/2023   Nocturia 01/17/2023   Chronic hepatitis C without hepatic coma (HCC) 01/17/2023   Cervical radiculopathy 04/06/2021   OA (osteoarthritis) of knee 08/07/2020   Bipolar 2 disorder, major depressive episode (HCC) 07/20/2020   GAD (generalized anxiety disorder) 07/20/2020   PTSD (post-traumatic stress disorder) 07/20/2020    PCP: Alfredia Ferguson, PA-C   REFERRING PROVIDER: Alfredia Ferguson, PA-C  REFERRING DIAG: [phone] cervical radiculopathy M50.3 Degeneration of cervical intervertebral disk  THERAPY DIAG:  Radiculopathy, cervical region  Cramp and spasm  Muscle weakness (generalized)  Cervicalgia  Rationale for Evaluation and Treatment: Rehabilitation  ONSET DATE: 04/18/23  SUBJECTIVE:                                                                                                                                                                                                         SUBJECTIVE STATEMENT: Neck and shoulders are spasming, especially  right between my shoulder blades.  I lifted a case of canned goods and that movement caused this pain.  Hand dominance: Right  PERTINENT HISTORY:  ACDF L5-7 on 04/18/23; PMH: Bipolar, PTSD, chronic hep C., R carpal tunnel release, R elbow arthroscopy   PAIN:  Are you having pain? Yes: NPRS scale: 4/10 Pain location: L upper shoulder/neck Pain description: pulling Aggravating factors: moving neck Relieving factors: keeping neck still, medication, heat  PRECAUTIONS: Cervical - lifting < 15 lbs for 6 weeks, limit overhead activities.   RED FLAGS: None  WEIGHT BEARING RESTRICTIONS: No  FALLS:  Has patient fallen in last 6 months? No  LIVING ENVIRONMENT: Lives with: lives with their family Lives in: House/apartment Stairs: No Has following equipment at home: None  OCCUPATION: Holiday representative  PLOF: Independent  PATIENT GOALS: decrease pain, be able to work  NEXT MD VISIT: 6 weeks  OBJECTIVE:   DIAGNOSTIC FINDINGS:  05/05/2023 XR spine cervical  IMPRESSION:   1.  No acute radiographic findings.  2.  C5-C6 and C6-C7 ACDF.   PATIENT SURVEYS:  NDI 37/50  COGNITION: Overall cognitive status: Within functional limits for tasks assessed  SENSATION: Light touch: Impaired  in fingers  POSTURE:  decreased cervical lordosis  PALPATION: Tenderness/spasm throughout cervical paraspinals, UT, levator scapulae bilaterally, L>R   CERVICAL ROM:   Active ROM A/PROM (deg) eval  Flexion 15p!  Extension 15p!  Right lateral flexion   Left lateral flexion   Right rotation 33p!  Left rotation 35p!   (Blank rows = not tested)  UPPER EXTREMITY ROM:  Active ROM Right eval Left eval  Shoulder flexion 135 130  Shoulder abduction 100 85   (Blank rows = not tested)  UPPER EXTREMITY MMT:  MMT Right eval Left eval  Shoulder flexion 5 5  Shoulder extension    Shoulder abduction 5 5  Shoulder internal rotation 5 5  Shoulder external rotation 5 5  Elbow flexion 5 5   Elbow extension 5 5  Wrist flexion 5 5  Wrist extension 5 5  Grip strength 60lbs 55lbs   (Blank rows = not tested)  CERVICAL SPECIAL TESTS:  NT  FUNCTIONAL TESTS:  NT   TODAY'S TREATMENT:                                                                                                                              DATE:  05/30/23: Nustep , level 5, attempted with Ue's and Le's to engage gentle rotation of thoracic spine and tissue perfusion, pt stopped after 3 min due to pain  Positioned in prone for assessment of thoracic spine, pt tender over T 4 spinous process, and L T 3 to 6 transverse processes, gentle PA pressure increased pain.  Utilized IFC x 12 min middle and upper traps, combined with moist heat in supine, pt did not tolerate supine lying longer than 12 min In sitting, performed: Trigger Point Dry Needling  Subsequent Treatment: Instructions provided previously at initial dry needling treatment.   Patient Verbal Consent Given: Yes Education Handout Provided: Previously Provided Muscles Treated: L upper traps, medially Electrical Stimulation Performed: No Treatment Response/Outcome: twitch response noted  Ice provided at end of session in modified side lying to L upper traps, neck . Not included in charges    05/25/23 Therapeutic Exercise: to improve strength and mobility.  Demo, verbal and tactile cues throughout for technique. UBE x 4 min f/b Cat cows - modified on counter Standing external rotation stretch Shoulder rolls forward back Thoracic mobilization foam  roller against wall Neck rotation with ball against wall  Wall angels - standing - tight and painful today- switched to supine partial ROM Shoulder flexion in supine Manual Therapy: to decrease muscle spasm and pain and improve mobility STM/TPR to cervical paraspinals, IASTM with s/s tools to L forearm - flexors and extensor group Modalities: MHP to neck ~ 6 min - concurrent with manual therapy to L forearm.      05/23/23 Therapeutic Exercise: to improve strength and mobility.  Demo, verbal and tactile cues throughout for technique. UBE x 4 min f/b Wall angels x 10 - very challenging Wall push-ups x 10 Ball rolls on wall 2 x 30 sec each side Cat cows x 10 - using push-up handles - less pressure on wrists, tactile cues needed Manual Therapy: to decrease muscle spasm and pain and improve mobility STM/TPR and IASTM to L forearm flexors/supinators and brachioradialis mm., mobs to proximal radius, skilled palpation and monitoring during dry needling. Trigger Point Dry Needling  Subsequent Treatment: Instructions provided previously at initial dry needling treatment.  Instructions reviewed, if requested by the patient, prior to subsequent dry needling treatment.   Patient Verbal Consent Given: Yes Education Handout Provided: Previously Provided Muscles Treated: L forearm flexors/supinators, brachioradialis mm. Electrical Stimulation Performed: No Treatment Response/Outcome: Twitch Response Elicited and Palpable Increase in Muscle Length    05/19/23 Therapeutic Exercise: to improve strength and mobility.  Demo, verbal and tactile cues throughout for technique. UBE L1 x 3 min f/b  Chest stretch sitting -  no resistance, thumbs up Open book stretch s/Lx 10 each side - L side with bent arm due to pull on chest Cat cows x 10 - difficult on wrists Ball rolls on wall 2 x 1 min each side Manual Therapy: to decrease muscle spasm and pain and improve mobility IASTM with s/s tool to L pects, biceps, forearm supinator, TPR to forearm flexors, STM/TPR to cervical paraspinals.  Modalities: MHP to neck in supine x 10 min (not included in treatment time)     PATIENT EDUCATION:  Education details: Handout on Tennis elbow exercises Person educated: Patient Education method: Explanation, Demonstration, and Verbal cues Education comprehension: verbalized understanding  HOME EXERCISE  PROGRAM: [email or web address]  Access Code: [email or web address] URL: [email or web address] Date: 05/15/2023 Prepared by: Verta Ellen  Exercises - Shoulder External Rotation and Scapular Retraction with Resistance  - 1 x daily - 3 x weekly - 2 sets - 10 reps - Standing Shoulder Horizontal Abduction with Resistance  - 1 x daily - 3 x weekly - 2 sets - 10 reps - Cervical Extension AROM with Strap  - 1 x daily - 7 x weekly - 2 sets - 10 reps - Seated Assisted Cervical Rotation with Towel  - 1 x daily - 7 x weekly - 2 sets - 10 reps  ASSESSMENT:  CLINICAL IMPRESSION: Patient is a 53 y.o. right hand dominant male who was seen today for physical therapy treatment for s/p ACDF on 04/18/2023. Today had flared, tissue irritability/ tightness L upper traps and middle traps, over T 2 to T5 area.  Therefore today's session was entirely focused on reducing his pain/ muscle spasm.  He was unable to tolerate supine position due to neck and L upper traps pain.  He is to return later this week so will address pain/ movement again at that visit.   Scott Meza continues to demonstrate potential for improvement and would benefit from continued skilled therapy to address impairments.  OBJECTIVE IMPAIRMENTS: decreased activity tolerance, decreased endurance, decreased ROM, decreased strength, increased edema, increased fascial restrictions, impaired perceived functional ability, increased muscle spasms, impaired sensation, impaired UE functional use, postural dysfunction, and pain.   ACTIVITY LIMITATIONS: carrying, lifting, bending, sitting, standing, sleeping, transfers, dressing, reach over head, hygiene/grooming, and caring for others  PARTICIPATION LIMITATIONS: meal prep, cleaning, laundry, driving, shopping, community activity, occupation, and yard work  PERSONAL FACTORS: Past/current experiences,  Time since onset of injury/illness/exacerbation, and 1-2 comorbidities:  ACDF, Bipolar, PTSD, chronic hep C., R carpal tunnel release,   are also affecting patient's functional outcome.   REHAB POTENTIAL: Good  CLINICAL DECISION MAKING: Evolving/moderate complexity  EVALUATION COMPLEXITY: Moderate   GOALS: Goals reviewed with patient? Yes  SHORT TERM GOALS: Target date: 05/23/2023   Patient will be independent with initial HEP.  Baseline:  Goal status: MET 05/23/23    LONG TERM GOALS: Target date: 06/20/2023   Patient will be independent with advanced/ongoing HEP to improve outcomes and carryover.  Baseline:  Goal status: IN PROGRESS  2.  Patient will report 75% improvement in neck pain to improve QOL.  Baseline: 7/10 Goal status: IN PROGRESS  3.  Patient will demonstrate full pain free cervical ROM for safety with driving.  Baseline: see objective Goal status: IN PROGRESS  4.  Patient will report at least 8 points improvement on NDI to demonstrate improved functional ability.  Baseline: 37/50 Goal status: IN PROGRESS  5.  Patient will demonstrate 75% improvement in radicular symptoms.  Baseline: still getting numbness/tingling down both hands especially in morning Goal status: IN PROGRESS  6. Patient will demonstrate improved grip strength to 75lbs bil. To perform job duties safely.  Baseline: 60lbs R, 55lbs L Goal status: IN PROGRESS      PLAN:  PT FREQUENCY: 2x/week  PT DURATION: 6 weeks  PLANNED INTERVENTIONS: 97110-Therapeutic exercises, 97530- Therapeutic activity, [id number]- Neuromuscular re-education, 97535- Self Care, [id number]- Manual therapy, [id number]- Gait training, [phone]- Ultrasound, Joint mobilization, Joint manipulation, Cryotherapy, and Moist heat  PLAN FOR NEXT SESSION: gentle cervical ROM, postural/core strengthening, grip strengthening, manual therapy, modalities PRN.     Vi Biddinger L Heather Streeper, PT, DPT, OCS 05/30/2023, 10:55 AM

## 2023-05-31 ENCOUNTER — Telehealth: Payer: Self-pay

## 2023-05-31 ENCOUNTER — Other Ambulatory Visit (HOSPITAL_COMMUNITY): Payer: Self-pay

## 2023-05-31 NOTE — Telephone Encounter (Signed)
 Pharmacy Patient Advocate Encounter  Insurance verification completed.   The patient is insured through [employer]   Ran test claim for [employer]. Currently a quantity of 84 is a 28 day supply and the co-pay is $4.00 .   This test claim was processed through [facility]- copay amounts may vary at other pharmacies due to pharmacy/plan contracts, or as the patient moves through the different stages of their insurance plan.

## 2023-06-01 ENCOUNTER — Ambulatory Visit: Admitting: Physical Therapy

## 2023-06-01 DIAGNOSIS — M6281 Muscle weakness (generalized): Secondary | ICD-10-CM

## 2023-06-01 DIAGNOSIS — R252 Cramp and spasm: Secondary | ICD-10-CM

## 2023-06-01 DIAGNOSIS — M542 Cervicalgia: Secondary | ICD-10-CM

## 2023-06-01 DIAGNOSIS — M5412 Radiculopathy, cervical region: Secondary | ICD-10-CM

## 2023-06-01 NOTE — Therapy (Signed)
 OUTPATIENT PHYSICAL THERAPY CERVICAL TREATMENT   Patient Name: Scott Meza MRN: [id number] DOB:08-13-1970, 53 y.o., male Today's Date: 06/01/2023  END OF SESSION:  PT End of Session - 06/01/23 0845     Visit Number 7    Authorization Type [employer]    Authorization Time Period 12 visits 3/17-6/14    Authorization - Visit Number 6    Authorization - Number of Visits 12    PT Start Time 0845    PT Stop Time 0930    PT Time Calculation (min) 45 min    Activity Tolerance Patient limited by pain    Behavior During Therapy [facility] for tasks assessed/performed                Past Medical History:  Diagnosis Date   Anxiety    Cervical radiculopathy    Past Surgical History:  Procedure Laterality Date   ANTERIOR CERVICAL DECOMP/DISCECTOMY FUSION  04/18/2023   C5-7   CARPAL TUNNEL RELEASE Right    ELBOW ARTHROSCOPY Right    HEMORROIDECTOMY     Patient Active Problem List   Diagnosis Date Noted   Primary hypertension 05/25/2023   Nocturia 01/17/2023   Chronic hepatitis C without hepatic coma (HCC) 01/17/2023   Cervical radiculopathy 04/06/2021   OA (osteoarthritis) of knee 08/07/2020   Bipolar 2 disorder, major depressive episode (HCC) 07/20/2020   GAD (generalized anxiety disorder) 07/20/2020   PTSD (post-traumatic stress disorder) 07/20/2020    PCP: Alfredia Ferguson, PA-C  REFERRING PROVIDER: Jonetta Speak, FNP  REFERRING DIAG: M54.12 cervical radiculopathy M50.3 Degeneration of cervical intervertebral disk  THERAPY DIAG:  Radiculopathy, cervical region  Cramp and spasm  Muscle weakness (generalized)  Cervicalgia  RATIONALE FOR EVALUATION AND TREATMENT: Rehabilitation  ONSET DATE: 04/18/23  NEXT MD VISIT: post-op visit with Jonetta Speak, FNP (Neurological Surgery) 06/20/23   SUBJECTIVE:                                                                                                                                                                                                          SUBJECTIVE STATEMENT: Pt reports the TPDN last visit was intense but did feel that it helped.  He notes his entire L arm was going to sleep/numb on him more yesterday - maybe related to picking up the box of canned goods that triggered him earlier this week.  Overall he feels like he has been slowly improving.   Hand dominance: Right  PERTINENT HISTORY:  ACDF L5-7 on 04/18/23; PMH: Bipolar, PTSD, chronic hep C., R  carpal tunnel release, R elbow arthroscopy   PAIN:  Are you having pain? Yes: NPRS scale: 3/10  Pain location: L scapula  Pain description: pulling Aggravating factors: moving neck Relieving factors: keeping neck still, medication, heat  PRECAUTIONS: Cervical - lifting < 15 lbs for 6 weeks, limit overhead activities.   RED FLAGS: None     WEIGHT BEARING RESTRICTIONS: No  FALLS:  Has patient fallen in last 6 months? No  LIVING ENVIRONMENT: Lives with: lives with their family Lives in: House/apartment Stairs: No Has following equipment at home: None  OCCUPATION: Holiday representative  PLOF: Independent  PATIENT GOALS: decrease pain, be able to work   OBJECTIVE:   DIAGNOSTIC FINDINGS:  05/05/2023 XR spine cervical  IMPRESSION:   1.  No acute radiographic findings.  2.  C5-C6 and C6-C7 ACDF.   PATIENT SURVEYS:  NDI 37/50  COGNITION: Overall cognitive status: Within functional limits for tasks assessed  SENSATION: Light touch: Impaired  in fingers  POSTURE:  decreased cervical lordosis  PALPATION: Tenderness/spasm throughout cervical paraspinals, UT, levator scapulae bilaterally, L>R   CERVICAL ROM:   Active ROM Eval  Flexion 15p!  Extension 15p!  Right lateral flexion   Left lateral flexion   Right rotation 33p!  Left rotation 35p!   (Blank rows = not tested)  UPPER EXTREMITY ROM:  Active ROM Right eval Left eval  Shoulder flexion 135 130  Shoulder abduction 100 85   (Blank rows = not  tested)  UPPER EXTREMITY MMT:  MMT Right eval Left eval  Shoulder flexion 5 5  Shoulder extension    Shoulder abduction 5 5  Shoulder internal rotation 5 5  Shoulder external rotation 5 5  Elbow flexion 5 5  Elbow extension 5 5  Wrist flexion 5 5  Wrist extension 5 5  Grip strength 60lbs 55lbs   (Blank rows = not tested)  CERVICAL SPECIAL TESTS:  NT  FUNCTIONAL TESTS:  NT   TODAY'S TREATMENT:                                                                                                                              DATE:   06/01/23 THERAPEUTIC EXERCISE: To improve strength, endurance, ROM, and flexibility.  Demonstration, verbal and tactile cues throughout for technique.  UBE x 5 min (2.5 min each fwd & back) Seated cervical & scapular retraction + thoracic extension into pool noodle along spine in back of chair 10 x 3" Seated thoracic extension + pec stretch with hands behind  into pool noodle along spine in back of chair 10 x 3" Gentle manual L UT and LS stretches in supine x 30"  MANUAL THERAPY: To promote normalized muscle tension, improved flexibility, and reduced pain utilizing connective tissue massage, therapeutic massage, and manual TP therapy. STM/DTM and IASTM with edge tool to L UT, LS and scalenes in sitting and supine  NEUROMUSCULAR RE-EDUCATION: To improve kinesthesia, posture, and proprioception. Lower trap setting at wall (  V wall slide + slight lift-off at end ROM) x 10 - VC & TC for scapular activation Standing RTB scapular retraction + B shoulder ER into pool noodle on wall x ~8 - discontinued due to increased discomfort in neck and L upper shoulder  Standing RTB scapular retraction + B shoulder horiz ABD into pool noodle on wall x ~8 - discontinued due to increased discomfort in neck and L upper shoulder   05/30/23: Nustep , level 5, attempted with Ue's and Le's to engage gentle rotation of thoracic spine and tissue perfusion, pt stopped after 3 min due to  pain  Positioned in prone for assessment of thoracic spine, pt tender over T 4 spinous process, and L T 3 to 6 transverse processes, gentle PA pressure increased pain.  Utilized IFC x 12 min middle and upper traps, combined with moist heat in supine, pt did not tolerate supine lying longer than 12 min  In sitting, performed: Trigger Point Dry Needling  Subsequent Treatment: Instructions provided previously at initial dry needling treatment.   Patient Verbal Consent Given: Yes Education Handout Provided: Previously Provided Muscles Treated: L upper traps, medially Electrical Stimulation Performed: No Treatment Response/Outcome: twitch response noted  Ice provided at end of session in modified side lying to L upper traps, neck . Not included in charges    05/25/23  Therapeutic Exercise: to improve strength and mobility.  Demo, verbal and tactile cues throughout for technique. UBE x 4 min f/b Cat cows - modified on counter Standing external rotation stretch Shoulder rolls forward back Thoracic mobilization foam roller against wall Neck rotation with ball against wall  Wall angels - standing - tight and painful today- switched to supine partial ROM Shoulder flexion in supine  Manual Therapy: to decrease muscle spasm and pain and improve mobility STM/TPR to cervical paraspinals, IASTM with s/s tools to L forearm - flexors and extensor group  Modalities: MHP to neck ~ 6 min - concurrent with manual therapy to L forearm.    PATIENT EDUCATION:  Education details: HEP review and postural awareness  Person educated: Patient Education method: Explanation, Demonstration, and Verbal cues Education comprehension: verbalized understanding  HOME EXERCISE PROGRAM: [email or web address]  Access Code: [email or web address] URL: [email or web address] Date: 05/15/2023 Prepared by: Verta Ellen  Exercises - Shoulder External Rotation and Scapular Retraction with Resistance  - 1 x daily - 3 x weekly - 2 sets - 10 reps - Standing Shoulder Horizontal Abduction with Resistance  - 1 x daily - 3 x weekly - 2 sets - 10 reps - Cervical Extension AROM with Strap  - 1 x daily - 7 x weekly - 2 sets - 10 reps - Seated Assisted Cervical Rotation with Towel  - 1 x daily - 7 x weekly - 2 sets - 10 reps   ASSESSMENT:  CLINICAL IMPRESSION: Scott "Thayer Ohm" reports some relief from TPDN last session but states he preferred not to try this again today despite lingering irritation from lifting ~40# box of canned good and pack of water earlier this week. Addressed increased muscle tension with manual STM/DTM and IASTM with some continued reduction in muscle tension achieved but review of HEP exercises triggering some increased pain requiring further MT. Benefit noted from thoracic extension with pec stretch and improved scapular engagement observed with lower trap setting, however scap retraction with ER or horiz ABD seemed to increase his pain in his upper shoulders requiring additional MT to alleviate pain and muscle spasms.  Thayer Ohm will benefit from continued skilled  PT to address ongoing impairments and deficits to improve mobility and activity tolerance with decreased pain interference.   OBJECTIVE IMPAIRMENTS: decreased activity tolerance, decreased endurance, decreased ROM, decreased strength, increased edema, increased fascial restrictions, impaired perceived functional ability, increased muscle spasms, impaired sensation, impaired UE functional use, postural dysfunction, and pain.   ACTIVITY LIMITATIONS: carrying, lifting, bending, sitting, standing, sleeping, transfers, dressing, reach over head, hygiene/grooming, and caring for others  PARTICIPATION LIMITATIONS: meal prep, cleaning, laundry, driving, shopping, community activity, occupation, and yard work  PERSONAL FACTORS: Past/current  experiences, Time since onset of injury/illness/exacerbation, and 1-2 comorbidities:  ACDF, Bipolar, PTSD, chronic hep C., R carpal tunnel release,   are also affecting patient's functional outcome.   REHAB POTENTIAL: Good  CLINICAL DECISION MAKING: Evolving/moderate complexity  EVALUATION COMPLEXITY: Moderate   GOALS: Goals reviewed with patient? Yes  SHORT TERM GOALS: Target date: 05/23/2023   Patient will be independent with initial HEP.  Baseline:  Goal status: MET - 05/23/23   LONG TERM GOALS: Target date: 06/20/2023   Patient will be independent with advanced/ongoing HEP to improve outcomes and carryover.  Baseline:  Goal status: IN PROGRESS  2.  Patient will report 75% improvement in neck pain to improve QOL.  Baseline: 7/10 Goal status: IN PROGRESS  3.  Patient will demonstrate full pain free cervical ROM for safety with driving.  Baseline: see objective Goal status: IN PROGRESS  4.  Patient will report at least 8 points improvement on NDI to demonstrate improved functional ability.  Baseline: 37/50 Goal status: IN PROGRESS  5.  Patient will demonstrate 75% improvement in radicular symptoms.  Baseline: still getting numbness/tingling down both hands especially in morning Goal status: IN PROGRESS  6. Patient will demonstrate improved grip strength to 75lbs bil. To perform job duties safely.  Baseline: 60lbs R, 55lbs L Goal status: IN PROGRESS    PLAN:  PT FREQUENCY: 2x/week  PT DURATION: 6 weeks  PLANNED INTERVENTIONS: 97110-Therapeutic exercises, 97530- Therapeutic activity, [id number]- Neuromuscular re-education, 97535- Self Care, [id number]- Manual therapy, [id number]- Gait training, [phone]- Ultrasound, Joint mobilization, Joint manipulation, Cryotherapy, and Moist heat  PLAN FOR NEXT SESSION: gentle cervical ROM, postural/core strengthening, grip strengthening, manual therapy, modalities PRN.     Marry Guan, PT 06/01/2023, 11:27 AM

## 2023-06-05 ENCOUNTER — Ambulatory Visit

## 2023-06-05 DIAGNOSIS — M5412 Radiculopathy, cervical region: Secondary | ICD-10-CM

## 2023-06-05 DIAGNOSIS — M542 Cervicalgia: Secondary | ICD-10-CM

## 2023-06-05 DIAGNOSIS — R252 Cramp and spasm: Secondary | ICD-10-CM

## 2023-06-05 DIAGNOSIS — M6281 Muscle weakness (generalized): Secondary | ICD-10-CM

## 2023-06-05 NOTE — Therapy (Signed)
 OUTPATIENT PHYSICAL THERAPY CERVICAL TREATMENT   Patient Name: Scott Meza MRN: [id number] DOB:1970-06-12, 53 y.o., male Today's Date: 06/05/2023  END OF SESSION:  PT End of Session - 06/05/23 0845     Visit Number 8    Authorization Type [facility] [employer]    Authorization Time Period 12 visits: 3/17- 6/14    Authorization - Visit Number 7    Authorization - Number of Visits 12    PT Start Time 0844    PT Stop Time 0938    PT Time Calculation (min) 54 min    Activity Tolerance Patient limited by pain    Behavior During Therapy [facility] for tasks assessed/performed                 Past Medical History:  Diagnosis Date   Anxiety    Cervical radiculopathy    Past Surgical History:  Procedure Laterality Date   ANTERIOR CERVICAL DECOMP/DISCECTOMY FUSION  04/18/2023   C5-7   CARPAL TUNNEL RELEASE Right    ELBOW ARTHROSCOPY Right    HEMORROIDECTOMY     Patient Active Problem List   Diagnosis Date Noted   Primary hypertension 05/25/2023   Nocturia 01/17/2023   Chronic hepatitis C without hepatic coma (HCC) 01/17/2023   Cervical radiculopathy 04/06/2021   OA (osteoarthritis) of knee 08/07/2020   Bipolar 2 disorder, major depressive episode (HCC) 07/20/2020   GAD (generalized anxiety disorder) 07/20/2020   PTSD (post-traumatic stress disorder) 07/20/2020    PCP: Alfredia Ferguson, PA-C  REFERRING PROVIDER: Jonetta Speak, FNP  REFERRING DIAG: M54.12 cervical radiculopathy M50.3 Degeneration of cervical intervertebral disk  THERAPY DIAG:  Radiculopathy, cervical region  Cramp and spasm  Muscle weakness (generalized)  Cervicalgia  RATIONALE FOR EVALUATION AND TREATMENT: Rehabilitation  ONSET DATE: 04/18/23  NEXT MD VISIT: post-op visit with Jonetta Speak, FNP (Neurological Surgery) 06/20/23   SUBJECTIVE:                                                                                                                                                                                                          SUBJECTIVE STATEMENT: Pt reports he was tight earlier today, but it worked itself out.  Hand dominance: Right  PERTINENT HISTORY:  ACDF L5-7 on 04/18/23; PMH: Bipolar, PTSD, chronic hep C., R carpal tunnel release, R elbow arthroscopy   PAIN:  Are you having pain? Yes: NPRS scale: 3/10  Pain location: L scapula  Pain description: pulling Aggravating factors: moving neck Relieving factors: keeping neck still, medication, heat  PRECAUTIONS: Cervical - lifting <  15 lbs for 6 weeks, limit overhead activities.   RED FLAGS: None     WEIGHT BEARING RESTRICTIONS: No  FALLS:  Has patient fallen in last 6 months? No  LIVING ENVIRONMENT: Lives with: lives with their family Lives in: House/apartment Stairs: No Has following equipment at home: None  OCCUPATION: Holiday representative  PLOF: Independent  PATIENT GOALS: decrease pain, be able to work   OBJECTIVE:   DIAGNOSTIC FINDINGS:  05/05/2023 XR spine cervical  IMPRESSION:   1.  No acute radiographic findings.  2.  C5-C6 and C6-C7 ACDF.   PATIENT SURVEYS:  NDI 37/50  COGNITION: Overall cognitive status: Within functional limits for tasks assessed  SENSATION: Light touch: Impaired  in fingers  POSTURE:  decreased cervical lordosis  PALPATION: Tenderness/spasm throughout cervical paraspinals, UT, levator scapulae bilaterally, L>R   CERVICAL ROM:   Active ROM Eval  Flexion 15p!  Extension 15p!  Right lateral flexion   Left lateral flexion   Right rotation 33p!  Left rotation 35p!   (Blank rows = not tested)  UPPER EXTREMITY ROM:  Active ROM Right eval Left eval  Shoulder flexion 135 130  Shoulder abduction 100 85   (Blank rows = not tested)  UPPER EXTREMITY MMT:  MMT Right eval Left eval  Shoulder flexion 5 5  Shoulder extension    Shoulder abduction 5 5  Shoulder internal rotation 5 5  Shoulder external rotation 5 5  Elbow flexion 5 5  Elbow  extension 5 5  Wrist flexion 5 5  Wrist extension 5 5  Grip strength 60lbs 55lbs   (Blank rows = not tested)  CERVICAL SPECIAL TESTS:  NT  FUNCTIONAL TESTS:  NT   TODAY'S TREATMENT:                                                                                                                              DATE:  06/05/23 NEUROMUSCULAR RE-EDUCATION: To improve kinesthesia, posture, and proprioception. UBE x 6 min (3 min each fwd & back) Standing rows GTB 10x5" Standing shoulder extension GTB 10x5" Standing shoulder flexion 1lb x 10 bil Rhomboid stretch UT stretch 2x30" bil  MANUAL THERAPY: To promote normalized muscle tension, improved flexibility, and reduced pain utilizing connective tissue massage, therapeutic massage, and manual TP therapy. IASTM with edge tool to L UT, LS, rhomboids in sitting  Modalities: MHP and TENS to neck 10 min supine  06/01/23 THERAPEUTIC EXERCISE: To improve strength, endurance, ROM, and flexibility.  Demonstration, verbal and tactile cues throughout for technique.  UBE x 5 min (2.5 min each fwd & back) Seated cervical & scapular retraction + thoracic extension into pool noodle along spine in back of chair 10 x 3" Seated thoracic extension + pec stretch with hands behind  into pool noodle along spine in back of chair 10 x 3" Gentle manual L UT and LS stretches in supine x 30"  MANUAL THERAPY: To promote normalized muscle tension, improved flexibility, and reduced  pain utilizing connective tissue massage, therapeutic massage, and manual TP therapy. STM/DTM and IASTM with edge tool to L UT, LS and scalenes in sitting and supine  NEUROMUSCULAR RE-EDUCATION: To improve kinesthesia, posture, and proprioception. Lower trap setting at wall (V wall slide + slight lift-off at end ROM) x 10 - VC & TC for scapular activation Standing RTB scapular retraction + B shoulder ER into pool noodle on wall x ~8 - discontinued due to increased discomfort in neck and L  upper shoulder  Standing RTB scapular retraction + B shoulder horiz ABD into pool noodle on wall x ~8 - discontinued due to increased discomfort in neck and L upper shoulder   05/30/23: Nustep , level 5, attempted with Ue's and Le's to engage gentle rotation of thoracic spine and tissue perfusion, pt stopped after 3 min due to pain  Positioned in prone for assessment of thoracic spine, pt tender over T 4 spinous process, and L T 3 to 6 transverse processes, gentle PA pressure increased pain.  Utilized IFC x 12 min middle and upper traps, combined with moist heat in supine, pt did not tolerate supine lying longer than 12 min  In sitting, performed: Trigger Point Dry Needling  Subsequent Treatment: Instructions provided previously at initial dry needling treatment.   Patient Verbal Consent Given: Yes Education Handout Provided: Previously Provided Muscles Treated: L upper traps, medially Electrical Stimulation Performed: No Treatment Response/Outcome: twitch response noted  Ice provided at end of session in modified side lying to L upper traps, neck . Not included in charges    05/25/23  Therapeutic Exercise: to improve strength and mobility.  Demo, verbal and tactile cues throughout for technique. UBE x 4 min f/b Cat cows - modified on counter Standing external rotation stretch Shoulder rolls forward back Thoracic mobilization foam roller against wall Neck rotation with ball against wall  Wall angels - standing - tight and painful today- switched to supine partial ROM Shoulder flexion in supine  Manual Therapy: to decrease muscle spasm and pain and improve mobility STM/TPR to cervical paraspinals, IASTM with s/s tools to L forearm - flexors and extensor group  Modalities: MHP to neck ~ 6 min - concurrent with manual therapy to L forearm.    PATIENT EDUCATION:  Education details: HEP review and postural awareness  Person educated: Patient Education method: Explanation,  Demonstration, and Verbal cues Education comprehension: verbalized understanding  HOME EXERCISE PROGRAM: [email or web address]  Access Code: [email or web address] URL: [email or web address] Date: 05/15/2023 Prepared by: Verta Ellen  Exercises - Shoulder External Rotation and Scapular Retraction with Resistance  - 1 x daily - 3 x weekly - 2 sets - 10 reps - Standing Shoulder Horizontal Abduction with Resistance  - 1 x daily - 3 x weekly - 2 sets - 10 reps - Cervical Extension AROM with Strap  - 1 x daily - 7 x weekly - 2 sets - 10 reps - Seated Assisted Cervical Rotation with Towel  - 1 x daily - 7 x weekly - 2 sets - 10 reps   ASSESSMENT:  CLINICAL IMPRESSION: Focused on postural strengthening and started some deltoids strengthening as well. Pt responded well and provided cues throughout session for form. He still is very tight along the L side of his neck but does report improvement from the manual therapy. TENS and moist heat post session to address pain/stiffness. Thayer Ohm will benefit from continued skilled PT to address ongoing impairments and deficits to improve mobility and activity tolerance with decreased pain interference.  OBJECTIVE IMPAIRMENTS: decreased activity tolerance, decreased endurance, decreased ROM, decreased strength, increased edema, increased fascial restrictions, impaired perceived functional ability, increased muscle spasms, impaired sensation, impaired UE functional use, postural dysfunction, and pain.   ACTIVITY LIMITATIONS: carrying, lifting, bending, sitting, standing, sleeping, transfers, dressing, reach over head, hygiene/grooming, and caring for others  PARTICIPATION LIMITATIONS: meal prep, cleaning, laundry, driving, shopping, community activity, occupation, and yard work  PERSONAL FACTORS: Past/current experiences, Time since onset of injury/illness/exacerbation,  and 1-2 comorbidities:  ACDF, Bipolar, PTSD, chronic hep C., R carpal tunnel release,   are also affecting patient's functional outcome.   REHAB POTENTIAL: Good  CLINICAL DECISION MAKING: Evolving/moderate complexity  EVALUATION COMPLEXITY: Moderate   GOALS: Goals reviewed with patient? Yes  SHORT TERM GOALS: Target date: 05/23/2023   Patient will be independent with initial HEP.  Baseline:  Goal status: MET - 05/23/23   LONG TERM GOALS: Target date: 06/20/2023   Patient will be independent with advanced/ongoing HEP to improve outcomes and carryover.  Baseline:  Goal status: IN PROGRESS  2.  Patient will report 75% improvement in neck pain to improve QOL.  Baseline: 7/10 Goal status: IN PROGRESS  3.  Patient will demonstrate full pain free cervical ROM for safety with driving.  Baseline: see objective Goal status: IN PROGRESS  4.  Patient will report at least 8 points improvement on NDI to demonstrate improved functional ability.  Baseline: 37/50 Goal status: IN PROGRESS  5.  Patient will demonstrate 75% improvement in radicular symptoms.  Baseline: still getting numbness/tingling down both hands especially in morning Goal status: IN PROGRESS  6. Patient will demonstrate improved grip strength to 75lbs bil. To perform job duties safely.  Baseline: 60lbs R, 55lbs L Goal status: IN PROGRESS    PLAN:  PT FREQUENCY: 2x/week  PT DURATION: 6 weeks  PLANNED INTERVENTIONS: 97110-Therapeutic exercises, 97530- Therapeutic activity, [id number]- Neuromuscular re-education, 97535- Self Care, [id number]- Manual therapy, [id number]- Gait training, [id number]- Ultrasound, Joint mobilization, Joint manipulation, Cryotherapy, and Moist heat  PLAN FOR NEXT SESSION: gentle cervical ROM, postural/core strengthening, grip strengthening, manual therapy, modalities PRN.     Darleene Cleaver, PTA 06/05/2023, 9:32 AM

## 2023-06-07 ENCOUNTER — Other Ambulatory Visit: Payer: Self-pay

## 2023-06-07 ENCOUNTER — Ambulatory Visit

## 2023-06-07 DIAGNOSIS — M6281 Muscle weakness (generalized): Secondary | ICD-10-CM

## 2023-06-07 DIAGNOSIS — M542 Cervicalgia: Secondary | ICD-10-CM

## 2023-06-07 DIAGNOSIS — R252 Cramp and spasm: Secondary | ICD-10-CM

## 2023-06-07 DIAGNOSIS — M5412 Radiculopathy, cervical region: Secondary | ICD-10-CM | POA: Diagnosis not present

## 2023-06-07 NOTE — Therapy (Signed)
 OUTPATIENT PHYSICAL THERAPY CERVICAL TREATMENT   Patient Name: Scott Meza MRN: [id number] DOB:05-Nov-1970, 53 y.o., male Today's Date: 06/07/2023  END OF SESSION:  PT End of Session - 06/07/23 0923     Visit Number 9    Authorization Type [employer] [facility] Medicaid    Authorization Time Period 12 visits: 3/17- 6/14    Authorization - Number of Visits 12    Progress Note Due on Visit 10    PT Start Time 0920    Activity Tolerance Patient limited by pain    Behavior During Therapy [facility] for tasks assessed/performed              Past Medical History:  Diagnosis Date   Anxiety    Cervical radiculopathy    Past Surgical History:  Procedure Laterality Date   ANTERIOR CERVICAL DECOMP/DISCECTOMY FUSION  04/18/2023   C5-7   CARPAL TUNNEL RELEASE Right    ELBOW ARTHROSCOPY Right    HEMORROIDECTOMY     Patient Active Problem List   Diagnosis Date Noted   Primary hypertension 05/25/2023   Nocturia 01/17/2023   Chronic hepatitis C without hepatic coma (HCC) 01/17/2023   Cervical radiculopathy 04/06/2021   OA (osteoarthritis) of knee 08/07/2020   Bipolar 2 disorder, major depressive episode (HCC) 07/20/2020   GAD (generalized anxiety disorder) 07/20/2020   PTSD (post-traumatic stress disorder) 07/20/2020    PCP: Alfredia Ferguson, PA-C  REFERRING PROVIDER: Jonetta Speak, FNP  REFERRING DIAG: M54.12 cervical radiculopathy M50.3 Degeneration of cervical intervertebral disk  THERAPY DIAG:  Radiculopathy, cervical region  Cervicalgia  Cramp and spasm  Muscle weakness (generalized)  RATIONALE FOR EVALUATION AND TREATMENT: Rehabilitation  ONSET DATE: 04/18/23  NEXT MD VISIT: post-op visit with Jonetta Speak, FNP (Neurological Surgery) 06/20/23   SUBJECTIVE:                                                                                                                                                                                                          SUBJECTIVE STATEMENT: Pt reports he is tight, always in L upper traps.  The resistance training with theraband seemed to help, e stim and heat helps too  Hand dominance: Right  PERTINENT HISTORY:  ACDF L5-7 on 04/18/23; PMH: Bipolar, PTSD, chronic hep C., R carpal tunnel release, R elbow arthroscopy   PAIN:  Are you having pain? Yes: NPRS scale: 3/10  Pain location: L scapula  Pain description: pulling Aggravating factors: moving neck Relieving factors: keeping neck still, medication, heat  PRECAUTIONS: Cervical - lifting < 15 lbs for 6  weeks, limit overhead activities.   RED FLAGS: None     WEIGHT BEARING RESTRICTIONS: No  FALLS:  Has patient fallen in last 6 months? No  LIVING ENVIRONMENT: Lives with: lives with their family Lives in: House/apartment Stairs: No Has following equipment at home: None  OCCUPATION: Holiday representative  PLOF: Independent  PATIENT GOALS: decrease pain, be able to work   OBJECTIVE:   DIAGNOSTIC FINDINGS:  05/05/2023 XR spine cervical  IMPRESSION:   1.  No acute radiographic findings.  2.  C5-C6 and C6-C7 ACDF.   PATIENT SURVEYS:  NDI 37/50  COGNITION: Overall cognitive status: Within functional limits for tasks assessed  SENSATION: Light touch: Impaired  in fingers  POSTURE:  decreased cervical lordosis  PALPATION: Tenderness/spasm throughout cervical paraspinals, UT, levator scapulae bilaterally, L>R   CERVICAL ROM:   Active ROM Eval  Flexion 15p!  Extension 15p!  Right lateral flexion   Left lateral flexion   Right rotation 33p!  Left rotation 35p!   (Blank rows = not tested)  UPPER EXTREMITY ROM:  Active ROM Right eval Left eval  Shoulder flexion 135 130  Shoulder abduction 100 85   (Blank rows = not tested)  UPPER EXTREMITY MMT:  MMT Right eval Left eval  Shoulder flexion 5 5  Shoulder extension    Shoulder abduction 5 5  Shoulder internal rotation 5 5  Shoulder external rotation 5 5  Elbow flexion 5 5   Elbow extension 5 5  Wrist flexion 5 5  Wrist extension 5 5  Grip strength 60lbs 55lbs   (Blank rows = not tested)  CERVICAL SPECIAL TESTS:  NT  FUNCTIONAL TESTS:  NT   TODAY'S TREATMENT:                                                                                                                              DATE:  06/08/23: Neuromuscular re education: UBE, seated 2.5 min for, 2.5 min back Standing for extension with green t band, b shoulders, cues to maintain shoulders depressed for this movement 20x Standing for green t band rows, 20 x Standing 1# shoulder flexion to 90 degrees.    Supine for moist heat and IFC, B upper/middle traps for 10 min Seated for myofascial release with blade L upper traps, rhomboids  06/05/23 NEUROMUSCULAR RE-EDUCATION: To improve kinesthesia, posture, and proprioception. UBE x 6 min (3 min each fwd & back) Standing rows GTB 10x5" Standing shoulder extension GTB 10x5" Standing shoulder flexion 1lb x 10 bil Rhomboid stretch UT stretch 2x30" bil  MANUAL THERAPY: To promote normalized muscle tension, improved flexibility, and reduced pain utilizing connective tissue massage, therapeutic massage, and manual TP therapy. IASTM with edge tool to L UT, LS, rhomboids in sitting  Modalities: MHP and TENS to neck 10 min supine  06/01/23 THERAPEUTIC EXERCISE: To improve strength, endurance, ROM, and flexibility.  Demonstration, verbal and tactile cues throughout for technique.  UBE x 5 min (2.5 min each fwd &  back) Seated cervical & scapular retraction + thoracic extension into pool noodle along spine in back of chair 10 x 3" Seated thoracic extension + pec stretch with hands behind  into pool noodle along spine in back of chair 10 x 3" Gentle manual L UT and LS stretches in supine x 30"  MANUAL THERAPY: To promote normalized muscle tension, improved flexibility, and reduced pain utilizing connective tissue massage, therapeutic massage, and manual TP  therapy. STM/DTM and IASTM with edge tool to L UT, LS and scalenes in sitting and supine  NEUROMUSCULAR RE-EDUCATION: To improve kinesthesia, posture, and proprioception. Lower trap setting at wall (V wall slide + slight lift-off at end ROM) x 10 - VC & TC for scapular activation Standing RTB scapular retraction + B shoulder ER into pool noodle on wall x ~8 - discontinued due to increased discomfort in neck and L upper shoulder  Standing RTB scapular retraction + B shoulder horiz ABD into pool noodle on wall x ~8 - discontinued due to increased discomfort in neck and L upper shoulder   05/30/23: Nustep , level 5, attempted with Ue's and Le's to engage gentle rotation of thoracic spine and tissue perfusion, pt stopped after 3 min due to pain  Positioned in prone for assessment of thoracic spine, pt tender over T 4 spinous process, and L T 3 to 6 transverse processes, gentle PA pressure increased pain.  Utilized IFC x 12 min middle and upper traps, combined with moist heat in supine, pt did not tolerate supine lying longer than 12 min  In sitting, performed: Trigger Point Dry Needling  Subsequent Treatment: Instructions provided previously at initial dry needling treatment.   Patient Verbal Consent Given: Yes Education Handout Provided: Previously Provided Muscles Treated: L upper traps, medially Electrical Stimulation Performed: No Treatment Response/Outcome: twitch response noted  Ice provided at end of session in modified side lying to L upper traps, neck . Not included in charges    05/25/23  Therapeutic Exercise: to improve strength and mobility.  Demo, verbal and tactile cues throughout for technique. UBE x 4 min f/b Cat cows - modified on counter Standing external rotation stretch Shoulder rolls forward back Thoracic mobilization foam roller against wall Neck rotation with ball against wall  Wall angels - standing - tight and painful today- switched to supine partial  ROM Shoulder flexion in supine  Manual Therapy: to decrease muscle spasm and pain and improve mobility STM/TPR to cervical paraspinals, IASTM with s/s tools to L forearm - flexors and extensor group  Modalities: MHP to neck ~ 6 min - concurrent with manual therapy to L forearm.    PATIENT EDUCATION:  Education details: HEP review and postural awareness  Person educated: Patient Education method: Explanation, Demonstration, and Verbal cues Education comprehension: verbalized understanding  HOME EXERCISE PROGRAM: [email or web address]  Access Code: [email or web address] URL: [email or web address] Date: 05/15/2023 Prepared by: Verta Ellen  Exercises - Shoulder External Rotation and Scapular Retraction with Resistance  - 1 x daily - 3 x weekly - 2 sets - 10 reps - Standing Shoulder Horizontal Abduction with Resistance  - 1 x daily - 3 x weekly - 2 sets - 10 reps - Cervical Extension AROM with Strap  - 1 x daily - 7 x weekly - 2 sets - 10 reps - Seated Assisted Cervical Rotation with Towel  - 1 x daily - 7 x weekly - 2 sets - 10 reps   ASSESSMENT:  CLINICAL IMPRESSION: Patient still with easily flared/ irritated tissue L  upper traps, rhomboid region, changed order of today's Rx , utilized pain management techniques prior to therex.  Did not add new ex today due to his easily flared Sx L lower neck.  If able he needs to transition to more complex upper body movements with light resistance to improve his activity tolerance and movement.  Thayer Ohm will benefit from continued skilled PT to address ongoing impairments and deficits to improve mobility and activity tolerance with decreased pain interference.   OBJECTIVE IMPAIRMENTS: decreased activity tolerance, decreased endurance, decreased ROM, decreased strength, increased edema, increased fascial restrictions, impaired perceived functional ability,  increased muscle spasms, impaired sensation, impaired UE functional use, postural dysfunction, and pain.   ACTIVITY LIMITATIONS: carrying, lifting, bending, sitting, standing, sleeping, transfers, dressing, reach over head, hygiene/grooming, and caring for others  PARTICIPATION LIMITATIONS: meal prep, cleaning, laundry, driving, shopping, community activity, occupation, and yard work  PERSONAL FACTORS: Past/current experiences, Time since onset of injury/illness/exacerbation, and 1-2 comorbidities:  ACDF, Bipolar, PTSD, chronic hep C., R carpal tunnel release,   are also affecting patient's functional outcome.   REHAB POTENTIAL: Good  CLINICAL DECISION MAKING: Evolving/moderate complexity  EVALUATION COMPLEXITY: Moderate   GOALS: Goals reviewed with patient? Yes  SHORT TERM GOALS: Target date: 05/23/2023   Patient will be independent with initial HEP.  Baseline:  Goal status: MET - 05/23/23   LONG TERM GOALS: Target date: 06/20/2023   Patient will be independent with advanced/ongoing HEP to improve outcomes and carryover.  Baseline:  Goal status: IN PROGRESS  2.  Patient will report 75% improvement in neck pain to improve QOL.  Baseline: 7/10 Goal status: IN PROGRESS  3.  Patient will demonstrate full pain free cervical ROM for safety with driving.  Baseline: see objective Goal status: IN PROGRESS  4.  Patient will report at least 8 points improvement on NDI to demonstrate improved functional ability.  Baseline: 37/50 Goal status: IN PROGRESS  5.  Patient will demonstrate 75% improvement in radicular symptoms.  Baseline: still getting numbness/tingling down both hands especially in morning Goal status: IN PROGRESS  6. Patient will demonstrate improved grip strength to 75lbs bil. To perform job duties safely.  Baseline: 60lbs R, 55lbs L Goal status: IN PROGRESS    PLAN:  PT FREQUENCY: 2x/week  PT DURATION: 6 weeks  PLANNED INTERVENTIONS: 97110-Therapeutic  exercises, 97530- Therapeutic activity, [id number]- Neuromuscular re-education, 97535- Self Care, 66440- Manual therapy, [id number]- Gait training, [id number]- Ultrasound, Joint mobilization, Joint manipulation, Cryotherapy, and Moist heat  PLAN FOR NEXT SESSION: gentle cervical ROM, postural/core strengthening, grip strengthening, manual therapy, modalities PRN.     Luvia Orzechowski L Nataley Bahri, PT, DPT, OCS 06/07/2023, 11:40 AM

## 2023-06-08 ENCOUNTER — Other Ambulatory Visit: Payer: Self-pay

## 2023-06-08 ENCOUNTER — Encounter: Payer: Self-pay | Admitting: Internal Medicine

## 2023-06-08 ENCOUNTER — Ambulatory Visit: Admitting: Internal Medicine

## 2023-06-08 ENCOUNTER — Ambulatory Visit: Admitting: Physical Therapy

## 2023-06-08 VITALS — BP 146/91 | HR 87 | Temp 98.0°F | Ht 72.0 in | Wt 251.0 lb

## 2023-06-08 DIAGNOSIS — B182 Chronic viral hepatitis C: Secondary | ICD-10-CM | POA: Diagnosis present

## 2023-06-08 NOTE — Progress Notes (Addendum)
 [facility]                                      [address], [place], [place], [address]                                               Phn. [phone]; Fax: [phone]                                                               Date:  Reason for Visit: Hepatitis C    HPI: Scott Meza is a 53 y.o.old male with hcv. VL 1.12 mill on 05/25/23(4.5 mill on 01/24/22) referred by PCP. PT staes treated for HCV about 10 years ago for 60 days, pt states it didn't work( he thinks he may have taken it consitnely_. [place] state he states he was treated 5 years ago, only completed half course. Does not recall names of meds.  Pt states no IVDA use. Pt has several tatttos, last non-professinal about 25 years ago. Incarcerated about 25 years ago. PT uesd to drink refgualrlly, last drink was ocuple days ago.  Quit smoking, 8 years ago.  Denies h/o injectable or intranasal cocaine use, blood transfusion, sharing of toothbrushes/razors, or sexual contact with known positive partners, incarceration or Financial planner.  No personal or family history of liver disease, Hepatitis or Liver cancer.  He has not received treatment to date   Denies any hospitalizations related to liver disease, jaundice, ascites, GI bleeding, mental status changes, abdominal pain and acholic stool.   ROS: Denies yellowish discoloration of sclera and skin, abdominal pain/distension, hematemesis.  Denis cough, fever, chills, nightsweats, nausea, vomiting, diarrhea, constipation, weight loss, recent hospitalizations, rashes, joint complaints, shortness of breath, chest pain, headaches, dysuria .  s/p Cervical discectomy w/ fusion 04/18/23  Current Outpatient Medications on File Prior to Visit  Medication Sig Dispense Refill   ibuprofen (ADVIL) 200 MG tablet Take 200 mg by mouth 2 (two) times daily.     lisinopril (ZESTRIL) 10 MG tablet Take 1 tablet  (10 mg total) by mouth daily. (Patient not taking: Reported on 06/08/2023) 90 tablet 1   No current facility-administered medications on file prior to visit.   No Known Allergies  Past Medical History:  Diagnosis Date   Anxiety    Cervical radiculopathy     Past Surgical History:  Procedure Laterality Date   ANTERIOR CERVICAL DECOMP/DISCECTOMY FUSION  04/18/2023   C5-7   CARPAL TUNNEL RELEASE Right    ELBOW ARTHROSCOPY Right    HEMORROIDECTOMY      Social History   Socioeconomic History   Marital status: Single    Spouse name: Not on file   Number of children: Not on file   Years of education: Not on file   Highest education level: Not on file  Occupational History   Not on file  Tobacco Use   Smoking status: Former   Smokeless tobacco: Former    Types: Chew  Substance and Sexual Activity   Alcohol use: Not Currently  Comment: 2 weeks ago drank twice    Drug use: Not Currently    Types: Marijuana    Comment: smoked a marijuna joint last week.    Sexual activity: Yes    Partners: Female  Other Topics Concern   Not on file  Social History Narrative   Not on file   Social Drivers of Health   Financial Resource Strain: High Risk (09/07/2021)   Received from [facility], [facility]   Overall Financial Resource Strain (CARDIA)    Difficulty of Paying Living Expenses: Very hard  Food Insecurity: Food Insecurity Present (09/07/2021)   Received from [facility], [facility]   Hunger Vital Sign    Worried About Running Out of Food in the Last Year: Often true    Ran Out of Food in the Last Year: Often true  Transportation Needs: No Transportation Needs (09/07/2021)   Received from [employer], [facility]   PRAPARE - Transportation    Lack of Transportation (Medical): No    Lack of Transportation (Non-Medical): No  Physical Activity: Sufficiently Active (09/07/2021)   Received from [facility], [facility]   Exercise Vital Sign    Days of  Exercise per Week: 2 days    Minutes of Exercise per Session: 80 min  Stress: No Stress Concern Present (04/18/2023)   Received from [facility] - Occupational Stress Questionnaire    Feeling of Stress : Not at all  Social Connections: Unknown (09/12/2022)   Received from [facility]   Social Network    Social Network: Not on file  Intimate Partner Violence: Not At Risk (04/18/2023)   Received from [facility]   HITS    Over the last 12 months how often did your partner physically hurt you?: Never    Over the last 12 months how often did your partner insult you or talk down to you?: Never    Over the last 12 months how often did your partner threaten you with physical harm?: Never    Over the last 12 months how often did your partner scream or curse at you?: Never    Family History  Adopted: Yes    Physical exam: BP (!) 146/91   Pulse 87   Temp 98 F (36.7 C) (Oral)   Ht 6' (1.829 m)   Wt 251 lb (113.9 kg)   SpO2 95%   BMI 34.04 kg/m   Gen: Alert and oriented x 3, no acute distress HEENT: [place]/AT, PERL, EOMI, no scleral icterus, no pale conjunctivae, hearing normal, oral mucosa moist Neck: Supple, no lymphadenopathy Cardio: Regular rate and rhythm; +S1 and S2; no murmurs, gallops, or rubs Resp: CTAB; no wheezes, rhonchi, or rales GI: Soft, nontender, nondistended, bowel sounds present GU: Musc: Extremities: No cyanosis, clubbing, or edema; +2 PT and DP pulses Skin: No rashes, lesions, or ecchymoses Neuro: No focal deficits Psych: Calm, cooperative   Assessment/Plan:  #Hepatitis C Prior treatment: yes x2, pt states x 60 days first time(unclear if he was adherent) then incompletely treated 5 years ago. Will get resistance testing.  Plan: Labs and U/S CBC w diff CMP PT/PTT/INR Hep A and B serologies  HIV HCV genotype HCV RNA Measure of fibrosis NS5A resistance testing in select scenarios F/U with pharmacy to start  medication, myself at the end of treatment  #Substance use Alcohol-counseled on Etoh cessation    #Counseling done on the following: -Natural progression of hep c, transmission (avoid sharing  personal hygiene equipment), prevention, risks of left untreated and treatment options  -Avoid hepatotoxins like alcohol and excessive acetamaminphen (no more than 2 gram a day) -Avoid eating raw sea food -Risks of re-infection  -Hepatitis coinfection and vaccination( Pneumococcal vaccination in the cirrhotics   #If indicated in the setting of cirrhosis: - HCC screening with US  every 6 months - EGD to r/o varices in cirrhotics    Electronically signed by:  Orlie Bjornstad, MD Infectious Diseases  Fax no. [phone]   I have personally spent 65 minutes involved in face-to-face and non-face-to-face activities for this patient on the day of the visit. Professional time spent includes the following activities: Preparing to see the patient (review of tests), Obtaining and/or reviewing separately obtained history (admission/discharge record), Performing a medically appropriate examination and/or evaluation , Ordering medications/tests/procedures, referring and communicating with other health care professionals, Documenting clinical information in the EMR, Independently interpreting results (not separately reported), Communicating results to the patient/family/caregiver, Counseling and educating the patient/family/caregiver and Care coordination (not separately reported).

## 2023-06-12 ENCOUNTER — Ambulatory Visit

## 2023-06-14 ENCOUNTER — Ambulatory Visit (HOSPITAL_COMMUNITY): Admission: RE | Admit: 2023-06-14 | Source: Ambulatory Visit

## 2023-06-15 ENCOUNTER — Ambulatory Visit

## 2023-06-18 LAB — LIVER FIBROSIS, FIBROTEST-ACTITEST
ALT: 59 U/L — ABNORMAL HIGH (ref 9–46)
Alpha-2-Macroglobulin: 233 mg/dL (ref 106–279)
Apolipoprotein A1: 143 mg/dL (ref 94–176)
Bilirubin: 0.6 mg/dL (ref 0.2–1.2)
Fibrosis Score: 0.44
GGT: 49 U/L (ref 3–95)
Haptoglobin: 83 mg/dL (ref 43–212)
Necroinflammat ACT Score: 0.4
Reference ID: 5436863

## 2023-06-18 LAB — HIV ANTIBODY (ROUTINE TESTING W REFLEX): HIV 1&2 Ab, 4th Generation: NONREACTIVE

## 2023-06-18 LAB — CBC
HCT: 51.5 % — ABNORMAL HIGH (ref 38.5–50.0)
Hemoglobin: 17.8 g/dL — ABNORMAL HIGH (ref 13.2–17.1)
MCH: 32 pg (ref 27.0–33.0)
MCHC: 34.6 g/dL (ref 32.0–36.0)
MCV: 92.5 fL (ref 80.0–100.0)
MPV: 13.1 fL — ABNORMAL HIGH (ref 7.5–12.5)
Platelets: 190 10*3/uL (ref 140–400)
RBC: 5.57 10*6/uL (ref 4.20–5.80)
RDW: 12.1 % (ref 11.0–15.0)
WBC: 7.5 10*3/uL (ref 3.8–10.8)

## 2023-06-18 LAB — COMPLETE METABOLIC PANEL WITHOUT GFR
AG Ratio: 1.8 (calc) (ref 1.0–2.5)
ALT: 60 U/L — ABNORMAL HIGH (ref 9–46)
AST: 29 U/L (ref 10–35)
Albumin: 4.3 g/dL (ref 3.6–5.1)
Alkaline phosphatase (APISO): 72 U/L (ref 35–144)
BUN: 16 mg/dL (ref 7–25)
CO2: 25 mmol/L (ref 20–32)
Calcium: 9.3 mg/dL (ref 8.6–10.3)
Chloride: 103 mmol/L (ref 98–110)
Creat: 0.93 mg/dL (ref 0.70–1.30)
Globulin: 2.4 g/dL (ref 1.9–3.7)
Glucose, Bld: 195 mg/dL — ABNORMAL HIGH (ref 65–99)
Potassium: 4.2 mmol/L (ref 3.5–5.3)
Sodium: 137 mmol/L (ref 135–146)
Total Bilirubin: 0.5 mg/dL (ref 0.2–1.2)
Total Protein: 6.7 g/dL (ref 6.1–8.1)

## 2023-06-18 LAB — HEPATITIS B SURFACE ANTIGEN: Hepatitis B Surface Ag: NONREACTIVE

## 2023-06-18 LAB — HCV VIRAL RNA GEN3 NS5A DRUG RESIST: HCV NS5a Subtype: NOT DETECTED

## 2023-06-18 LAB — HEPATITIS C GENOTYPE: HCV Genotype: 2

## 2023-06-18 LAB — HEPATITIS A ANTIBODY, TOTAL: Hepatitis A AB,Total: NONREACTIVE

## 2023-06-18 LAB — PROTIME-INR
INR: 1
Prothrombin Time: 10.4 s (ref 9.0–11.5)

## 2023-06-18 LAB — HEPATITIS B SURFACE ANTIBODY,QUALITATIVE: Hep B S Ab: NONREACTIVE

## 2023-06-18 LAB — HEPATITIS B CORE ANTIBODY, TOTAL: Hep B Core Total Ab: NONREACTIVE

## 2023-06-20 ENCOUNTER — Ambulatory Visit

## 2023-06-22 ENCOUNTER — Encounter: Admitting: Physical Therapy

## 2023-06-26 ENCOUNTER — Encounter

## 2023-06-29 ENCOUNTER — Encounter

## 2023-07-07 ENCOUNTER — Ambulatory Visit (HOSPITAL_COMMUNITY)

## 2023-07-18 NOTE — Progress Notes (Deleted)
 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 male with a past medical history listed below presents for evaluation of ***.   *** Discussed the use of AI scribe software for clinical note transcription with the patient, who gave verbal consent to proceed.  History of Present Illness            He  reports that he has quit smoking. He has quit using smokeless tobacco.  His smokeless tobacco use included chew. He reports that he does not currently use alcohol. He reports that he does not currently use drugs after having used the following drugs: Marijuana.  RELEVANT GI HISTORY, IMAGING AND LABS: Results          CBC    Component Value Date/Time   WBC 7.5 06/08/2023 1034   RBC 5.57 06/08/2023 1034   HGB 17.8 (H) 06/08/2023 1034   HCT 51.5 (H) 06/08/2023 1034   PLT 190 06/08/2023 1034   MCV 92.5 06/08/2023 1034   MCH 32.0 06/08/2023 1034   MCHC 34.6 06/08/2023 1034   RDW 12.1 06/08/2023 1034   LYMPHSABS 1.9 02/02/2023 0905   MONOABS 0.7 02/02/2023 0905   EOSABS 0.3 02/02/2023 0905   BASOSABS  0.2 (H) 02/02/2023 0905   Recent Labs    01/17/23 1556 02/02/23 0905 06/08/23 1034  HGB 16.6 17.5* 17.8*    CMP     Component Value Date/Time   NA 137 06/08/2023 1034   NA 140 01/24/2022 1119   K 4.2 06/08/2023 1034   CL 103 06/08/2023 1034   CO2 25 06/08/2023 1034   GLUCOSE 195 (H) 06/08/2023 1034   BUN 16 06/08/2023 1034   BUN 9 01/24/2022 1119   CREATININE 0.93 06/08/2023 1034   CALCIUM 9.3 06/08/2023 1034   PROT 6.7 06/08/2023 1034   PROT 7.0 01/24/2022 1119   ALBUMIN 4.7 05/25/2023 1006   ALBUMIN 4.7 01/24/2022 1119   AST 29 06/08/2023 1034   ALT 59 (H) 06/08/2023 1034   ALT 60 (H) 06/08/2023 1034   ALKPHOS 81 05/25/2023 1006   BILITOT 0.5 06/08/2023 1034   BILITOT 0.4 01/24/2022 1119      Latest Ref Rng & Units 06/08/2023   10:34 AM 05/25/2023   10:06 AM 01/17/2023    3:56 PM  Hepatic Function  Total Protein 6.1 - 8.1 g/dL 6.7  7.1  6.9   Albumin 3.5 - 5.2 g/dL  4.7  4.7   AST 10 - 35 U/L 29  33  26   ALT 9 - 46 U/L 9 - 46 U/L 59    60  69  56   Alk Phosphatase 39 - 117 U/L  81  71   Total Bilirubin 0.2 -  1.2 mg/dL 0.5  0.6  0.5       Latest Ref Rng & Units 05/25/2023   10:34 AM 06/08/2023   10:34 AM  Hepatitis C  Hep C Genotype   2   HCV Quanitative Log NOT DETECTED Log IU/mL 6.05      Current Medications:    Current Outpatient Medications (Cardiovascular):    lisinopril  (ZESTRIL ) 10 MG tablet, Take 1 tablet (10 mg total) by mouth daily. (Patient not taking: Reported on 06/08/2023)   Current Outpatient Medications (Analgesics):    ibuprofen (ADVIL) 200 MG tablet, Take 200 mg by mouth 2 (two) times daily.    Medical History:  Past Medical History:  Diagnosis Date   Anxiety    Cervical radiculopathy    Allergies: No Known Allergies   Surgical History:  He  has a past surgical history that includes Elbow arthroscopy (Right); Carpal tunnel release (Right); Hemorroidectomy; and Anterior cervical decomp/discectomy fusion (04/18/2023). Family  History:  His family history is not on file. He was adopted.  REVIEW OF SYSTEMS  : All other systems reviewed and negative except where noted in the History of Present Illness.  PHYSICAL EXAM: There were no vitals taken for this visit. Physical Exam          Edmonia Gottron, PA-C 3:48 PM

## 2023-07-19 ENCOUNTER — Ambulatory Visit: Admitting: Physician Assistant
